# Patient Record
Sex: Female | Born: 1970 | Race: Black or African American | Hispanic: No | Smoking: Former smoker
Health system: Southern US, Community
[De-identification: ages and names within clinical notes are randomized; demographics above are authoritative.]

## PROBLEM LIST (undated history)

## (undated) DIAGNOSIS — B360 Pityriasis versicolor: Secondary | ICD-10-CM

## (undated) DIAGNOSIS — M549 Dorsalgia, unspecified: Secondary | ICD-10-CM

## (undated) DIAGNOSIS — M5126 Other intervertebral disc displacement, lumbar region: Secondary | ICD-10-CM

## (undated) DIAGNOSIS — I10 Essential (primary) hypertension: Secondary | ICD-10-CM

## (undated) DIAGNOSIS — D649 Anemia, unspecified: Secondary | ICD-10-CM

## (undated) DIAGNOSIS — L0293 Carbuncle, unspecified: Secondary | ICD-10-CM

## (undated) DIAGNOSIS — K219 Gastro-esophageal reflux disease without esophagitis: Secondary | ICD-10-CM

## (undated) DIAGNOSIS — R030 Elevated blood-pressure reading, without diagnosis of hypertension: Secondary | ICD-10-CM

## (undated) DIAGNOSIS — E109 Type 1 diabetes mellitus without complications: Secondary | ICD-10-CM

## (undated) DIAGNOSIS — N946 Dysmenorrhea, unspecified: Secondary | ICD-10-CM

## (undated) DIAGNOSIS — E876 Hypokalemia: Secondary | ICD-10-CM

## (undated) DIAGNOSIS — R142 Eructation: Secondary | ICD-10-CM

## (undated) HISTORY — DX: Gastro-esophageal reflux disease without esophagitis: K21.9

## (undated) HISTORY — DX: Anemia, unspecified: D64.9

## (undated) HISTORY — DX: Eructation: R14.2

## (undated) HISTORY — DX: Dorsalgia, unspecified: M54.9

## (undated) HISTORY — DX: Carbuncle, unspecified: L02.93

## (undated) HISTORY — DX: Type 1 diabetes mellitus without complications: E10.9

## (undated) HISTORY — DX: Hypokalemia: E87.6

## (undated) HISTORY — DX: Pityriasis versicolor: B36.0

## (undated) HISTORY — DX: Other intervertebral disc displacement, lumbar region: M51.26

## (undated) HISTORY — DX: Elevated blood-pressure reading, without diagnosis of hypertension: R03.0

## (undated) HISTORY — DX: Dysmenorrhea, unspecified: N94.6

---

## 1994-04-28 HISTORY — PX: ECTOPIC PREGNANCY SURGERY: SHX613

## 1997-10-06 ENCOUNTER — Emergency Department (HOSPITAL_COMMUNITY): Admission: EM | Admit: 1997-10-06 | Discharge: 1997-10-06 | Payer: Self-pay | Admitting: Internal Medicine

## 1998-06-17 DIAGNOSIS — E109 Type 1 diabetes mellitus without complications: Secondary | ICD-10-CM | POA: Insufficient documentation

## 1998-06-25 ENCOUNTER — Inpatient Hospital Stay (HOSPITAL_COMMUNITY): Admission: EM | Admit: 1998-06-25 | Discharge: 1998-06-27 | Payer: Self-pay | Admitting: Emergency Medicine

## 1998-08-05 ENCOUNTER — Emergency Department (HOSPITAL_COMMUNITY): Admission: EM | Admit: 1998-08-05 | Discharge: 1998-08-05 | Payer: Self-pay | Admitting: Internal Medicine

## 1998-08-05 ENCOUNTER — Encounter: Payer: Self-pay | Admitting: Internal Medicine

## 1999-01-30 ENCOUNTER — Encounter: Admission: RE | Admit: 1999-01-30 | Discharge: 1999-01-30 | Payer: Self-pay | Admitting: Hematology and Oncology

## 1999-03-10 ENCOUNTER — Encounter: Payer: Self-pay | Admitting: Emergency Medicine

## 1999-03-10 ENCOUNTER — Emergency Department (HOSPITAL_COMMUNITY): Admission: EM | Admit: 1999-03-10 | Discharge: 1999-03-10 | Payer: Self-pay | Admitting: Emergency Medicine

## 1999-03-24 ENCOUNTER — Emergency Department (HOSPITAL_COMMUNITY): Admission: EM | Admit: 1999-03-24 | Discharge: 1999-03-24 | Payer: Self-pay | Admitting: Emergency Medicine

## 1999-03-25 ENCOUNTER — Emergency Department (HOSPITAL_COMMUNITY): Admission: EM | Admit: 1999-03-25 | Discharge: 1999-03-25 | Payer: Self-pay | Admitting: Emergency Medicine

## 1999-05-20 ENCOUNTER — Encounter: Admission: RE | Admit: 1999-05-20 | Discharge: 1999-05-20 | Payer: Self-pay | Admitting: Internal Medicine

## 1999-06-03 ENCOUNTER — Encounter: Admission: RE | Admit: 1999-06-03 | Discharge: 1999-09-01 | Payer: Self-pay | Admitting: *Deleted

## 1999-06-18 ENCOUNTER — Encounter: Payer: Self-pay | Admitting: Emergency Medicine

## 1999-06-18 ENCOUNTER — Emergency Department (HOSPITAL_COMMUNITY): Admission: EM | Admit: 1999-06-18 | Discharge: 1999-06-18 | Payer: Self-pay | Admitting: Emergency Medicine

## 1999-09-04 ENCOUNTER — Emergency Department (HOSPITAL_COMMUNITY): Admission: EM | Admit: 1999-09-04 | Discharge: 1999-09-04 | Payer: Self-pay | Admitting: Emergency Medicine

## 1999-11-22 ENCOUNTER — Encounter: Admission: RE | Admit: 1999-11-22 | Discharge: 1999-11-22 | Payer: Self-pay | Admitting: Internal Medicine

## 1999-11-27 ENCOUNTER — Encounter: Admission: RE | Admit: 1999-11-27 | Discharge: 1999-11-27 | Payer: Self-pay | Admitting: Internal Medicine

## 2000-06-20 ENCOUNTER — Emergency Department (HOSPITAL_COMMUNITY): Admission: EM | Admit: 2000-06-20 | Discharge: 2000-06-20 | Payer: Self-pay | Admitting: *Deleted

## 2000-06-20 ENCOUNTER — Encounter: Payer: Self-pay | Admitting: *Deleted

## 2000-06-30 ENCOUNTER — Encounter: Payer: Self-pay | Admitting: Emergency Medicine

## 2000-06-30 ENCOUNTER — Emergency Department (HOSPITAL_COMMUNITY): Admission: EM | Admit: 2000-06-30 | Discharge: 2000-07-01 | Payer: Self-pay | Admitting: Emergency Medicine

## 2000-12-08 ENCOUNTER — Emergency Department (HOSPITAL_COMMUNITY): Admission: EM | Admit: 2000-12-08 | Discharge: 2000-12-08 | Payer: Self-pay | Admitting: Emergency Medicine

## 2001-12-30 ENCOUNTER — Encounter: Payer: Self-pay | Admitting: Emergency Medicine

## 2001-12-30 ENCOUNTER — Inpatient Hospital Stay (HOSPITAL_COMMUNITY): Admission: EM | Admit: 2001-12-30 | Discharge: 2002-01-01 | Payer: Self-pay | Admitting: Emergency Medicine

## 2001-12-31 ENCOUNTER — Encounter: Payer: Self-pay | Admitting: Internal Medicine

## 2003-08-16 ENCOUNTER — Inpatient Hospital Stay (HOSPITAL_COMMUNITY): Admission: EM | Admit: 2003-08-16 | Discharge: 2003-08-17 | Payer: Self-pay | Admitting: Emergency Medicine

## 2003-11-17 ENCOUNTER — Observation Stay (HOSPITAL_COMMUNITY): Admission: EM | Admit: 2003-11-17 | Discharge: 2003-11-18 | Payer: Self-pay | Admitting: Emergency Medicine

## 2003-12-14 ENCOUNTER — Inpatient Hospital Stay (HOSPITAL_COMMUNITY): Admission: EM | Admit: 2003-12-14 | Discharge: 2003-12-16 | Payer: Self-pay | Admitting: Emergency Medicine

## 2004-04-26 ENCOUNTER — Emergency Department (HOSPITAL_COMMUNITY): Admission: EM | Admit: 2004-04-26 | Discharge: 2004-04-26 | Payer: Self-pay

## 2004-04-29 ENCOUNTER — Emergency Department (HOSPITAL_COMMUNITY): Admission: EM | Admit: 2004-04-29 | Discharge: 2004-04-29 | Payer: Self-pay | Admitting: Family Medicine

## 2005-04-03 ENCOUNTER — Emergency Department (HOSPITAL_COMMUNITY): Admission: EM | Admit: 2005-04-03 | Discharge: 2005-04-03 | Payer: Self-pay | Admitting: Family Medicine

## 2005-11-09 ENCOUNTER — Emergency Department (HOSPITAL_COMMUNITY): Admission: EM | Admit: 2005-11-09 | Discharge: 2005-11-09 | Payer: Self-pay | Admitting: Emergency Medicine

## 2006-01-11 ENCOUNTER — Emergency Department (HOSPITAL_COMMUNITY): Admission: EM | Admit: 2006-01-11 | Discharge: 2006-01-11 | Payer: Self-pay | Admitting: Emergency Medicine

## 2006-01-27 ENCOUNTER — Emergency Department (HOSPITAL_COMMUNITY): Admission: EM | Admit: 2006-01-27 | Discharge: 2006-01-27 | Payer: Self-pay | Admitting: Emergency Medicine

## 2006-04-06 ENCOUNTER — Inpatient Hospital Stay (HOSPITAL_COMMUNITY): Admission: EM | Admit: 2006-04-06 | Discharge: 2006-04-09 | Payer: Self-pay | Admitting: Emergency Medicine

## 2006-10-19 ENCOUNTER — Ambulatory Visit: Payer: Self-pay | Admitting: Internal Medicine

## 2006-10-19 ENCOUNTER — Inpatient Hospital Stay (HOSPITAL_COMMUNITY): Admission: EM | Admit: 2006-10-19 | Discharge: 2006-10-22 | Payer: Self-pay | Admitting: Emergency Medicine

## 2006-11-06 ENCOUNTER — Encounter (INDEPENDENT_AMBULATORY_CARE_PROVIDER_SITE_OTHER): Payer: Self-pay | Admitting: Internal Medicine

## 2006-11-06 ENCOUNTER — Ambulatory Visit: Payer: Self-pay | Admitting: Hospitalist

## 2006-11-06 DIAGNOSIS — E876 Hypokalemia: Secondary | ICD-10-CM

## 2006-11-06 DIAGNOSIS — D649 Anemia, unspecified: Secondary | ICD-10-CM

## 2006-11-06 DIAGNOSIS — E109 Type 1 diabetes mellitus without complications: Secondary | ICD-10-CM

## 2006-11-06 HISTORY — DX: Anemia, unspecified: D64.9

## 2006-11-06 HISTORY — DX: Type 1 diabetes mellitus without complications: E10.9

## 2006-11-06 LAB — CONVERTED CEMR LAB
Basophils Absolute: 0 10*3/uL (ref 0.0–0.1)
Calcium: 9.4 mg/dL (ref 8.4–10.5)
Eosinophils Relative: 1 % (ref 0–5)
HCT: 34.5 % — ABNORMAL LOW (ref 36.0–46.0)
Hgb A1c MFr Bld: 11.6 %
Monocytes Absolute: 0.4 10*3/uL (ref 0.2–0.7)
Neutro Abs: 3.5 10*3/uL (ref 1.7–7.7)
Neutrophils Relative %: 60 % (ref 43–77)
Platelets: 309 10*3/uL (ref 150–400)
Potassium: 4.2 meq/L (ref 3.5–5.3)
Sodium: 131 meq/L — ABNORMAL LOW (ref 135–145)
WBC: 5.9 10*3/uL (ref 4.0–10.5)

## 2006-11-09 ENCOUNTER — Ambulatory Visit: Payer: Self-pay | Admitting: *Deleted

## 2006-11-09 LAB — CONVERTED CEMR LAB
Blood Glucose, Fingerstick: 80
Blood Glucose, Home Monitor: 2 mg/dL
Insulin/Carbohydrate Ratio: 1

## 2006-12-09 ENCOUNTER — Ambulatory Visit: Payer: Self-pay | Admitting: Infectious Disease

## 2006-12-09 ENCOUNTER — Encounter (INDEPENDENT_AMBULATORY_CARE_PROVIDER_SITE_OTHER): Payer: Self-pay | Admitting: Internal Medicine

## 2006-12-09 LAB — CONVERTED CEMR LAB
ALT: 16 units/L (ref 0–35)
AST: 15 units/L (ref 0–37)
Albumin: 4.1 g/dL (ref 3.5–5.2)
CO2: 28 meq/L (ref 19–32)
Calcium: 10.2 mg/dL (ref 8.4–10.5)
Chloride: 101 meq/L (ref 96–112)
Creatinine, Ser: 0.63 mg/dL (ref 0.40–1.20)
Glucose, Bld: 137 mg/dL — ABNORMAL HIGH (ref 70–99)
Potassium: 4.3 meq/L (ref 3.5–5.3)
Sodium: 138 meq/L (ref 135–145)
Total CHOL/HDL Ratio: 2.7
Triglycerides: 86 mg/dL (ref <150)

## 2006-12-17 ENCOUNTER — Telehealth (INDEPENDENT_AMBULATORY_CARE_PROVIDER_SITE_OTHER): Payer: Self-pay | Admitting: *Deleted

## 2007-02-03 ENCOUNTER — Telehealth (INDEPENDENT_AMBULATORY_CARE_PROVIDER_SITE_OTHER): Payer: Self-pay | Admitting: Internal Medicine

## 2007-02-18 ENCOUNTER — Ambulatory Visit: Payer: Self-pay | Admitting: Internal Medicine

## 2007-02-18 DIAGNOSIS — R141 Gas pain: Secondary | ICD-10-CM | POA: Insufficient documentation

## 2007-02-18 DIAGNOSIS — R142 Eructation: Secondary | ICD-10-CM

## 2007-02-18 DIAGNOSIS — R143 Flatulence: Secondary | ICD-10-CM

## 2007-02-18 HISTORY — DX: Eructation: R14.2

## 2007-02-18 LAB — CONVERTED CEMR LAB
Blood Glucose, Fingerstick: 207
Hgb A1c MFr Bld: 10.4 %
Ketones, urine, test strip: NEGATIVE
Protein, U semiquant: NEGATIVE

## 2007-02-19 ENCOUNTER — Encounter (INDEPENDENT_AMBULATORY_CARE_PROVIDER_SITE_OTHER): Payer: Self-pay | Admitting: Internal Medicine

## 2007-04-14 ENCOUNTER — Emergency Department (HOSPITAL_COMMUNITY): Admission: EM | Admit: 2007-04-14 | Discharge: 2007-04-14 | Payer: Self-pay | Admitting: Emergency Medicine

## 2007-07-06 ENCOUNTER — Emergency Department (HOSPITAL_COMMUNITY): Admission: EM | Admit: 2007-07-06 | Discharge: 2007-07-06 | Payer: Self-pay | Admitting: Emergency Medicine

## 2007-07-08 ENCOUNTER — Encounter (INDEPENDENT_AMBULATORY_CARE_PROVIDER_SITE_OTHER): Payer: Self-pay | Admitting: Internal Medicine

## 2007-07-08 ENCOUNTER — Ambulatory Visit: Payer: Self-pay | Admitting: Infectious Diseases

## 2007-07-08 DIAGNOSIS — L0292 Furuncle, unspecified: Secondary | ICD-10-CM | POA: Insufficient documentation

## 2007-07-08 DIAGNOSIS — L0293 Carbuncle, unspecified: Secondary | ICD-10-CM

## 2007-07-08 HISTORY — DX: Furuncle, unspecified: L02.92

## 2007-07-08 HISTORY — DX: Carbuncle, unspecified: L02.93

## 2007-07-08 LAB — CONVERTED CEMR LAB
Basophils Absolute: 0 10*3/uL (ref 0.0–0.1)
Basophils Relative: 0 % (ref 0–1)
Eosinophils Relative: 1 % (ref 0–5)
HCT: 37.6 % (ref 36.0–46.0)
Lymphocytes Relative: 28 % (ref 12–46)
Lymphs Abs: 2.7 10*3/uL (ref 0.7–4.0)
MCHC: 31.1 g/dL (ref 30.0–36.0)
Monocytes Absolute: 0.8 10*3/uL (ref 0.1–1.0)
Monocytes Relative: 9 % (ref 3–12)
Neutro Abs: 5.9 10*3/uL (ref 1.7–7.7)
Platelets: 251 10*3/uL (ref 150–400)
RBC: 4.35 M/uL (ref 3.87–5.11)
RDW: 14.1 % (ref 11.5–15.5)

## 2007-07-22 ENCOUNTER — Ambulatory Visit: Payer: Self-pay | Admitting: *Deleted

## 2007-07-28 ENCOUNTER — Telehealth (INDEPENDENT_AMBULATORY_CARE_PROVIDER_SITE_OTHER): Payer: Self-pay | Admitting: *Deleted

## 2007-08-31 ENCOUNTER — Emergency Department (HOSPITAL_COMMUNITY): Admission: EM | Admit: 2007-08-31 | Discharge: 2007-08-31 | Payer: Self-pay | Admitting: Family Medicine

## 2007-10-30 ENCOUNTER — Emergency Department (HOSPITAL_COMMUNITY): Admission: EM | Admit: 2007-10-30 | Discharge: 2007-10-30 | Payer: Self-pay | Admitting: Emergency Medicine

## 2007-11-01 ENCOUNTER — Encounter (INDEPENDENT_AMBULATORY_CARE_PROVIDER_SITE_OTHER): Payer: Self-pay | Admitting: *Deleted

## 2007-11-01 ENCOUNTER — Ambulatory Visit: Payer: Self-pay | Admitting: Internal Medicine

## 2007-11-01 LAB — CONVERTED CEMR LAB
Creatinine, Ser: 0.67 mg/dL (ref 0.40–1.20)
Glucose, Bld: 490 mg/dL — ABNORMAL HIGH (ref 70–99)
MCHC: 32.9 g/dL (ref 30.0–36.0)
MCV: 86.4 fL (ref 78.0–100.0)
Platelets: 236 10*3/uL (ref 150–400)
RDW: 14.2 % (ref 11.5–15.5)
Sodium: 132 meq/L — ABNORMAL LOW (ref 135–145)

## 2007-11-08 ENCOUNTER — Encounter (INDEPENDENT_AMBULATORY_CARE_PROVIDER_SITE_OTHER): Payer: Self-pay | Admitting: *Deleted

## 2007-11-08 ENCOUNTER — Ambulatory Visit: Payer: Self-pay | Admitting: Infectious Diseases

## 2007-11-08 LAB — CONVERTED CEMR LAB: Blood Glucose, Fingerstick: 209

## 2007-11-09 ENCOUNTER — Telehealth (INDEPENDENT_AMBULATORY_CARE_PROVIDER_SITE_OTHER): Payer: Self-pay | Admitting: *Deleted

## 2007-11-19 ENCOUNTER — Telehealth (INDEPENDENT_AMBULATORY_CARE_PROVIDER_SITE_OTHER): Payer: Self-pay | Admitting: *Deleted

## 2007-12-02 ENCOUNTER — Emergency Department (HOSPITAL_COMMUNITY): Admission: EM | Admit: 2007-12-02 | Discharge: 2007-12-03 | Payer: Self-pay | Admitting: Emergency Medicine

## 2007-12-03 ENCOUNTER — Telehealth (INDEPENDENT_AMBULATORY_CARE_PROVIDER_SITE_OTHER): Payer: Self-pay | Admitting: Internal Medicine

## 2008-01-06 ENCOUNTER — Telehealth (INDEPENDENT_AMBULATORY_CARE_PROVIDER_SITE_OTHER): Payer: Self-pay | Admitting: Internal Medicine

## 2008-01-27 DIAGNOSIS — M549 Dorsalgia, unspecified: Secondary | ICD-10-CM

## 2008-01-27 HISTORY — DX: Dorsalgia, unspecified: M54.9

## 2008-02-07 ENCOUNTER — Telehealth: Payer: Self-pay | Admitting: *Deleted

## 2008-02-09 ENCOUNTER — Encounter: Payer: Self-pay | Admitting: Internal Medicine

## 2008-02-09 ENCOUNTER — Ambulatory Visit: Payer: Self-pay | Admitting: Infectious Disease

## 2008-02-09 DIAGNOSIS — IMO0002 Reserved for concepts with insufficient information to code with codable children: Secondary | ICD-10-CM | POA: Insufficient documentation

## 2008-02-09 LAB — CONVERTED CEMR LAB: Hgb A1c MFr Bld: 10.1 %

## 2008-02-10 ENCOUNTER — Encounter (INDEPENDENT_AMBULATORY_CARE_PROVIDER_SITE_OTHER): Payer: Self-pay | Admitting: Internal Medicine

## 2008-02-10 ENCOUNTER — Telehealth: Payer: Self-pay | Admitting: Internal Medicine

## 2008-02-10 ENCOUNTER — Ambulatory Visit: Payer: Self-pay | Admitting: Infectious Disease

## 2008-02-11 ENCOUNTER — Ambulatory Visit (HOSPITAL_COMMUNITY): Admission: RE | Admit: 2008-02-11 | Discharge: 2008-02-11 | Payer: Self-pay | Admitting: Internal Medicine

## 2008-02-20 DIAGNOSIS — E559 Vitamin D deficiency, unspecified: Secondary | ICD-10-CM | POA: Insufficient documentation

## 2008-02-21 LAB — CONVERTED CEMR LAB
ALT: 15 units/L (ref 0–35)
AST: 12 units/L (ref 0–37)
Albumin: 4 g/dL (ref 3.5–5.2)
Alkaline Phosphatase: 80 units/L (ref 39–117)
BUN: 8 mg/dL (ref 6–23)
CO2: 24 meq/L (ref 19–32)
Calcium: 9.3 mg/dL (ref 8.4–10.5)
Chloride: 101 meq/L (ref 96–112)
Cholesterol: 146 mg/dL (ref 0–200)
Creatinine, Ser: 0.74 mg/dL (ref 0.40–1.20)
Creatinine, Urine: 51.9 mg/dL
Glucose, Bld: 357 mg/dL — ABNORMAL HIGH (ref 70–99)
HDL: 50 mg/dL (ref >39)
LDL Cholesterol: 79 mg/dL (ref 0–99)
Microalb Creat Ratio: 7.5 mg/g (ref 0.0–30.0)
Microalb, Ur: 0.39 mg/dL (ref 0.00–1.89)
Potassium: 4.5 meq/L (ref 3.5–5.3)
Sodium: 136 meq/L (ref 135–145)
TSH: 0.94 microintl units/mL (ref 0.350–4.50)
Total Bilirubin: 0.4 mg/dL (ref 0.3–1.2)
Total CHOL/HDL Ratio: 2.9
Total Protein: 7.6 g/dL (ref 6.0–8.3)
Triglycerides: 83 mg/dL (ref <150)
VLDL: 17 mg/dL (ref 0–40)
Vit D, 1,25-Dihydroxy: 21 — ABNORMAL LOW (ref 30–89)

## 2008-02-25 ENCOUNTER — Ambulatory Visit: Payer: Self-pay | Admitting: Internal Medicine

## 2008-02-25 DIAGNOSIS — M5126 Other intervertebral disc displacement, lumbar region: Secondary | ICD-10-CM

## 2008-02-25 HISTORY — DX: Other intervertebral disc displacement, lumbar region: M51.26

## 2008-02-28 ENCOUNTER — Telehealth: Payer: Self-pay | Admitting: *Deleted

## 2008-04-03 ENCOUNTER — Encounter (INDEPENDENT_AMBULATORY_CARE_PROVIDER_SITE_OTHER): Payer: Self-pay | Admitting: Internal Medicine

## 2008-04-03 ENCOUNTER — Ambulatory Visit: Payer: Self-pay | Admitting: Internal Medicine

## 2008-04-11 ENCOUNTER — Telehealth: Payer: Self-pay | Admitting: *Deleted

## 2008-04-26 ENCOUNTER — Telehealth: Payer: Self-pay | Admitting: *Deleted

## 2008-05-30 ENCOUNTER — Telehealth: Payer: Self-pay | Admitting: *Deleted

## 2008-05-31 ENCOUNTER — Telehealth: Payer: Self-pay | Admitting: *Deleted

## 2008-06-07 ENCOUNTER — Ambulatory Visit: Payer: Self-pay | Admitting: *Deleted

## 2008-06-07 ENCOUNTER — Encounter (INDEPENDENT_AMBULATORY_CARE_PROVIDER_SITE_OTHER): Payer: Self-pay | Admitting: Internal Medicine

## 2008-06-07 DIAGNOSIS — R030 Elevated blood-pressure reading, without diagnosis of hypertension: Secondary | ICD-10-CM

## 2008-06-07 DIAGNOSIS — IMO0001 Reserved for inherently not codable concepts without codable children: Secondary | ICD-10-CM

## 2008-06-07 HISTORY — DX: Reserved for inherently not codable concepts without codable children: IMO0001

## 2008-06-07 LAB — CONVERTED CEMR LAB
Blood Glucose, Fingerstick: 382
Hgb A1c MFr Bld: 11 %

## 2008-06-08 LAB — CONVERTED CEMR LAB
Alkaline Phosphatase: 94 units/L (ref 39–117)
CO2: 22 meq/L (ref 19–32)
Glucose, Bld: 377 mg/dL — ABNORMAL HIGH (ref 70–99)
Microalb Creat Ratio: 40.8 mg/g — ABNORMAL HIGH (ref 0.0–30.0)
Sodium: 138 meq/L (ref 135–145)
Total Bilirubin: 0.3 mg/dL (ref 0.3–1.2)
Total Protein: 7.9 g/dL (ref 6.0–8.3)

## 2008-06-21 ENCOUNTER — Other Ambulatory Visit: Payer: Self-pay | Admitting: Emergency Medicine

## 2008-06-21 ENCOUNTER — Inpatient Hospital Stay (HOSPITAL_COMMUNITY): Admission: AD | Admit: 2008-06-21 | Discharge: 2008-06-24 | Payer: Self-pay | Admitting: Infectious Disease

## 2008-06-21 ENCOUNTER — Ambulatory Visit: Payer: Self-pay | Admitting: Infectious Disease

## 2008-06-25 ENCOUNTER — Encounter: Payer: Self-pay | Admitting: Internal Medicine

## 2008-07-06 ENCOUNTER — Emergency Department (HOSPITAL_COMMUNITY): Admission: EM | Admit: 2008-07-06 | Discharge: 2008-07-06 | Payer: Self-pay | Admitting: Family Medicine

## 2008-08-14 ENCOUNTER — Ambulatory Visit: Payer: Self-pay | Admitting: Internal Medicine

## 2008-08-14 ENCOUNTER — Encounter (INDEPENDENT_AMBULATORY_CARE_PROVIDER_SITE_OTHER): Payer: Self-pay | Admitting: *Deleted

## 2008-08-14 LAB — CONVERTED CEMR LAB: Insulin/Carbohydrate Ratio: 1

## 2008-08-15 ENCOUNTER — Encounter (INDEPENDENT_AMBULATORY_CARE_PROVIDER_SITE_OTHER): Payer: Self-pay | Admitting: Internal Medicine

## 2008-08-15 LAB — CONVERTED CEMR LAB
Alkaline Phosphatase: 106 units/L (ref 39–117)
GFR calc Af Amer: >60 mL/min (ref >60)
Glucose, Bld: 134 mg/dL — ABNORMAL HIGH (ref 70–99)
Total Bilirubin: 0.2 mg/dL — ABNORMAL LOW (ref 0.3–1.2)
Total Protein: 7.8 g/dL (ref 6.0–8.3)

## 2008-08-24 ENCOUNTER — Encounter: Admission: RE | Admit: 2008-08-24 | Discharge: 2008-09-06 | Payer: Self-pay | Admitting: Internal Medicine

## 2008-08-29 ENCOUNTER — Encounter (INDEPENDENT_AMBULATORY_CARE_PROVIDER_SITE_OTHER): Payer: Self-pay | Admitting: Internal Medicine

## 2008-09-28 ENCOUNTER — Encounter (INDEPENDENT_AMBULATORY_CARE_PROVIDER_SITE_OTHER): Payer: Self-pay | Admitting: Internal Medicine

## 2008-11-28 ENCOUNTER — Telehealth: Payer: Self-pay | Admitting: Internal Medicine

## 2008-12-21 ENCOUNTER — Ambulatory Visit: Payer: Self-pay | Admitting: Internal Medicine

## 2008-12-21 DIAGNOSIS — N946 Dysmenorrhea, unspecified: Secondary | ICD-10-CM | POA: Insufficient documentation

## 2008-12-21 DIAGNOSIS — B360 Pityriasis versicolor: Secondary | ICD-10-CM | POA: Insufficient documentation

## 2008-12-21 DIAGNOSIS — K219 Gastro-esophageal reflux disease without esophagitis: Secondary | ICD-10-CM

## 2008-12-21 HISTORY — DX: Pityriasis versicolor: B36.0

## 2008-12-21 HISTORY — DX: Gastro-esophageal reflux disease without esophagitis: K21.9

## 2008-12-21 HISTORY — DX: Dysmenorrhea, unspecified: N94.6

## 2008-12-21 LAB — CONVERTED CEMR LAB
BUN: 10 mg/dL (ref 6–23)
Blood Glucose, Fingerstick: 125
HCT: 38.1 % (ref 36.0–46.0)
Hemoglobin: 12 g/dL (ref 12.0–15.0)
Hgb A1c MFr Bld: 10.4 %
Platelets: 307 10*3/uL (ref 150–400)
RDW: 14.6 % (ref 11.5–15.5)
Sodium: 140 meq/L (ref 135–145)
WBC: 7 10*3/uL (ref 4.0–10.5)

## 2008-12-25 ENCOUNTER — Ambulatory Visit (HOSPITAL_COMMUNITY): Admission: RE | Admit: 2008-12-25 | Discharge: 2008-12-25 | Payer: Self-pay | Admitting: Internal Medicine

## 2009-01-16 ENCOUNTER — Ambulatory Visit: Payer: Self-pay | Admitting: Internal Medicine

## 2009-05-23 ENCOUNTER — Ambulatory Visit: Payer: Self-pay | Admitting: Internal Medicine

## 2009-05-24 ENCOUNTER — Encounter: Payer: Self-pay | Admitting: Internal Medicine

## 2009-05-27 LAB — CONVERTED CEMR LAB
BUN: 11 mg/dL (ref 6–23)
CO2: 19 meq/L (ref 19–32)
Chloride: 97 meq/L (ref 96–112)
Cholesterol: 168 mg/dL (ref 0–200)
Creatinine, Ser: 0.7 mg/dL (ref 0.40–1.20)
Glucose, Bld: 353 mg/dL — ABNORMAL HIGH (ref 70–99)
Hemoglobin: 12.1 g/dL (ref 12.0–15.0)
MCHC: 31.9 g/dL (ref 30.0–36.0)
MCV: 90.5 fL (ref >=78.0)
Potassium: 3.9 meq/L (ref 3.5–5.3)
RBC: 4.19 M/uL (ref 3.87–5.11)

## 2009-05-28 ENCOUNTER — Telehealth: Payer: Self-pay | Admitting: Internal Medicine

## 2009-06-06 ENCOUNTER — Telehealth (INDEPENDENT_AMBULATORY_CARE_PROVIDER_SITE_OTHER): Payer: Self-pay | Admitting: *Deleted

## 2009-06-11 ENCOUNTER — Ambulatory Visit: Payer: Self-pay | Admitting: Internal Medicine

## 2009-06-11 LAB — CONVERTED CEMR LAB: Blood Glucose, Fingerstick: 186

## 2009-06-26 ENCOUNTER — Encounter (INDEPENDENT_AMBULATORY_CARE_PROVIDER_SITE_OTHER): Payer: Self-pay | Admitting: Internal Medicine

## 2009-11-27 ENCOUNTER — Telehealth: Payer: Self-pay | Admitting: Licensed Clinical Social Worker

## 2009-11-27 ENCOUNTER — Encounter (INDEPENDENT_AMBULATORY_CARE_PROVIDER_SITE_OTHER): Payer: Self-pay | Admitting: *Deleted

## 2009-11-27 ENCOUNTER — Ambulatory Visit: Payer: Self-pay | Admitting: Internal Medicine

## 2009-11-27 LAB — CONVERTED CEMR LAB: Microalb Creat Ratio: 25.7 mg/g (ref 0.0–30.0)

## 2009-12-03 ENCOUNTER — Emergency Department (HOSPITAL_COMMUNITY): Admission: EM | Admit: 2009-12-03 | Discharge: 2009-12-04 | Payer: Self-pay | Admitting: Emergency Medicine

## 2009-12-11 ENCOUNTER — Ambulatory Visit: Payer: Self-pay | Admitting: Internal Medicine

## 2009-12-25 ENCOUNTER — Ambulatory Visit: Payer: Self-pay | Admitting: Internal Medicine

## 2010-03-13 ENCOUNTER — Telehealth: Payer: Self-pay | Admitting: Licensed Clinical Social Worker

## 2010-04-16 ENCOUNTER — Encounter: Payer: Self-pay | Admitting: Internal Medicine

## 2010-04-16 LAB — HM DIABETES EYE EXAM

## 2010-05-30 NOTE — Consult Note (Signed)
Summary: DIABETIC EYE EXAMINATION REPORT  DIABETIC EYE EXAMINATION REPORT   Imported By: Margie Billet 05/06/2010 12:00:59  _____________________________________________________________________  External Attachment:    Type:   Image     Comment:   External Document

## 2010-05-30 NOTE — Miscellaneous (Signed)
Summary: Orders Update  Clinical Lists Changes  Orders: Added new Referral order of Social Work Referral (Social ) - Signed 

## 2010-05-30 NOTE — Assessment & Plan Note (Signed)
Summary: EST-2 WEEK F/U VISIT/CH   Vital Signs:  Patient profile:   40 year old female Height:      64.25 inches (163.19 cm) Weight:      229.8 pounds (104.45 kg) BMI:     39.28 Temp:     97.9 degrees F (36.61 degrees C) oral Pulse rate:   85 / minute BP sitting:   125 / 86  (left arm) Cuff size:   large  Vitals Entered By: Theotis Barrio NT II (December 11, 2009 8:59 AM) CC: CRAMPS  /  BACK PAIN CHRONIC   /  DM FOLLOW UPAPPT Is Patient Diabetic? Yes Did you bring your meter with you today? No Pain Assessment Patient in pain? yes     Location: back Intensity:      7 Type: SHARP/ CRAMP Onset of pain  MONTHLY  / CHRONIC Nutritional Status BMI of > 30 = obese CBG Result 433  Have you ever been in a relationship where you felt threatened, hurt or afraid?No   Does patient need assistance? Functional Status Self care Ambulation Normal   Primary Care Provider:  Silvestre Gunner MD  CC:  CRAMPS  /  BACK PAIN CHRONIC   /  DM FOLLOW UPAPPT.  History of Present Illness: 40 y/o female with pmh as outlined in the EMR. Who comes to the clinic for folowup of her DM and to followup on her status to get MAP.  Patient is not taking lantus due to cast now taking 70/30 30units and 18units, couldnt download meter, however CBG's are on average more than 300, and lowest was 150 in AM.   Patient is feeling well and denies CP, abdominal pain, nausea, vomiting, HA's, palpitations, blurred vision. fever, chills, diarrhea, constipation or SOB.    Preventive Screening-Counseling & Management  Alcohol-Tobacco     Alcohol drinks/day: 1     Alcohol type: wine     Smoking Status: current     Smoking Cessation Counseling: yes     Packs/Day: 2-3 cigs per week     Year Started: 1985     Year Quit: 1 year ago     Pack years: 1     Passive Smoke Exposure: no  Caffeine-Diet-Exercise     Does Patient Exercise: no  Current Medications (verified): 1)  Pen Needles 31g X 8 Mm  Misc (Insulin Pen  Needle) .... Use To Inject Insulin 2)  Truetrack Test   Strp (Glucose Blood) .... Use To Test Blood Glucose 4x Daily 3)  Lancets   Misc (Lancets) .... Use To Test Blood Glucose 4x Daily 4)  Vitamin D3 1000 Unit Caps (Cholecalciferol) .... Take 1 Tablet By Mouth Once A Day 5)  Ferrous Sulfate 325 (65 Fe) Mg Tabs (Ferrous Sulfate) .... Take 1 Tablet By Mouth Two Times A Day 6)  Omeprazole 20 Mg Cpdr (Omeprazole) .... Take 1 Tablet By Mouth Once A Day 7)  Novolin 70/30 70-30 % Susp (Insulin Isophane & Regular) .... 35 Units in Am, and 18 Units in Pm. 8)  Truetrack Blood Glucose  Devi (Blood Glucose Monitoring Suppl) .... Use As Instructed  Allergies (verified): No Known Drug Allergies  Review of Systems       As per HPI.   Physical Exam  General:  alert, well-developed, well-nourished, and well-hydrated.   Neck:  supple.   Lungs:  normal respiratory effort, normal breath sounds, no crackles, and no wheezes.   Heart:  normal rate, regular rhythm, no murmur, no gallop, and  no JVD.   Abdomen:  soft, non-tender, normal bowel sounds, and no distention.   Msk:  normal ROM, no joint tenderness, no joint swelling, no joint warmth, and no redness over joints.   Pulses:  2+ Extremities:  No edema. Neurologic:  alert & oriented X3, cranial nerves II-XII intact, strength normal in all extremities, sensation intact to light touch, and gait normal.   Skin:   turgor normal and no rashes.  Psych:  Oriented X3, memory intact for recent and remote, normally interactive, good eye contact, not anxious appearing, and not depressed appearing.     Impression & Recommendations:  Problem # 1:  DIABETES MELLITUS, TYPE I (ICD-250.01)  Patient cannot pay for lantus, now is back on 70/30 on 30 in am and 18 in pm.  currently CBG are high, with highest > 500 in pm, lowest is >150 in am.  will increase morning 70/30 insulin to 35 units in am.  Also patients metformin was stopped last due to to an unknown cause,  patient stated that she tolerated it well, will restart at 500bid and titrate up.  will see again in 2 weeks.    The following medications were removed from the medication list:    Novolog 100 Unit/ml Soln (Insulin aspart) ..... Inject 0-14 units before each meal, depending cbg and amount of carbohydrates. (follow dosing guide) Her updated medication list for this problem includes:    Novolin 70/30 70-30 % Susp (Insulin isophane & regular) .Marland KitchenMarland KitchenMarland KitchenMarland Kitchen 35 units in am, and 18 units in pm.    Metformin Hcl 500 Mg Tabs (Metformin hcl) .Marland Kitchen... Take 1 tablet by mouth two times a day  Orders: Ophthalmology Referral (Ophthalmology)  Problem # 2:  GERD (ICD-530.81) Well controlled on current treatment, No new changes made today, Will continue to monitor.   Her updated medication list for this problem includes:    Omeprazole 20 Mg Cpdr (Omeprazole) .Marland Kitchen... Take 1 tablet by mouth once a day  Problem # 3:  DYSMENORRHEA (ICD-625.3) pain with cramps, not relieved with OTC meds will give small amount of tramadol for as needed use.   Problem # 4:  ANEMIA-NOS (ICD-285.9) Well controlled on current treatment, No new changes made today, Will continue to monitor.   Her updated medication list for this problem includes:    Ferrous Sulfate 325 (65 Fe) Mg Tabs (Ferrous sulfate) .Marland Kitchen... Take 1 tablet by mouth two times a day  Complete Medication List: 1)  Pen Needles 31g X 8 Mm Misc (Insulin pen needle) .... Use to inject insulin 2)  Truetrack Test Strp (Glucose blood) .... Use to test blood glucose 4x daily 3)  Lancets Misc (Lancets) .... Use to test blood glucose 4x daily 4)  Vitamin D3 1000 Unit Caps (Cholecalciferol) .... Take 1 tablet by mouth once a day 5)  Ferrous Sulfate 325 (65 Fe) Mg Tabs (Ferrous sulfate) .... Take 1 tablet by mouth two times a day 6)  Omeprazole 20 Mg Cpdr (Omeprazole) .... Take 1 tablet by mouth once a day 7)  Novolin 70/30 70-30 % Susp (Insulin isophane & regular) .... 35 units in am, and  18 units in pm. 8)  Truetrack Blood Glucose Devi (Blood glucose monitoring suppl) .... Use as instructed 9)  Tramadol Hcl 50 Mg Tabs (Tramadol hcl) .... Take 1 tablet by mouth three times a day as needed for pain 10)  Metformin Hcl 500 Mg Tabs (Metformin hcl) .... Take 1 tablet by mouth two times a day  Other Orders:  Capillary Blood Glucose/CBG (16109) Capillary Blood Glucose/CBG (60454)  Patient Instructions: 1)  Please schedule a follow-up appointment in 2 weeks. 2)  please increase your morning dose of 70/30 to 35 units.  3)  Prescription for pain medication was sent to your pharmacy.  Prescriptions: TRAMADOL HCL 50 MG TABS (TRAMADOL HCL) Take 1 tablet by mouth three times a day as needed for pain  #30 x 0   Entered and Authorized by:   Darnelle Maffucci MD   Signed by:   Darnelle Maffucci MD on 12/11/2009   Method used:   Print then Give to Patient   RxID:   [id number] METFORMIN HCL 500 MG TABS (METFORMIN HCL) Take 1 tablet by mouth two times a day  #60 x 3   Entered and Authorized by:   Darnelle Maffucci MD   Signed by:   Darnelle Maffucci MD on 12/11/2009   Method used:   Print then Give to Patient   RxID:   [id number] TRAMADOL HCL 50 MG TABS (TRAMADOL HCL) Take 1 tablet by mouth three times a day as needed for pain  #30 x 0   Entered and Authorized by:   Darnelle Maffucci MD   Signed by:   Darnelle Maffucci MD on 12/11/2009   Method used:   Faxed to ...       [facility] (retail)       [address]       [place], [place]  [address]       Ph: [phone]       Fax: [phone]   RxID:   [id number]    Prevention & Chronic Care Immunizations   Influenza vaccine: Fluvax Non-MCR  (02/09/2008)   Influenza vaccine deferral: Refused  (12/21/2008)    Tetanus booster: Not documented   Td booster deferral: Deferred  (12/21/2008)    Pneumococcal vaccine: Not documented  Other Screening   Pap smear: Not documented   Pap smear  action/deferral: Deferred  (12/21/2008)   Smoking status: current  (12/11/2009)   Smoking cessation counseling: yes  (12/11/2009)  Diabetes Mellitus   HgbA1C: 10.3  (11/27/2009)   Hemoglobin A1C due: 02/06/2007    Eye exam: Not documented   Diabetic eye exam action/deferral: Ophthalmology referral  (12/11/2009)    Foot exam: Not documented   Foot exam action/deferral: Do today   High risk foot: No  (08/14/2008)   Foot care education: Not documented    Urine microalbumin/creatinine ratio: 25.7  (11/27/2009)    Diabetes flowsheet reviewed?: Yes   Progress toward A1C goal: Improved  Lipids   Total Cholesterol: 168  (05/24/2009)   Lipid panel action/deferral: Lipid Panel ordered   LDL: 100  (05/24/2009)   LDL Direct: Not documented   HDL: 52  (05/24/2009)   Triglycerides: 81  (05/24/2009)  Self-Management Support :   Personal Goals (by the next clinic visit) :     Personal A1C goal: 8  (05/23/2009)     Personal blood pressure goal: 130/80  (05/23/2009)     Personal LDL goal: 100  (05/23/2009)    Patient will work on the following items until the next clinic visit to reach self-care goals:     Medications and monitoring: take my medicines every day, check my blood sugar, bring all of my medications to every visit, examine my feet every day  (12/11/2009)     Eating: drink diet soda or water instead of juice or soda, eat more vegetables, use fresh or frozen  vegetables, eat foods that are low in salt, eat baked foods instead of fried foods, eat fruit for snacks and desserts, limit or avoid alcohol  (12/11/2009)     Activity: take a 30 minute walk every day  (11/27/2009)     Other: bring meter to every visit  (05/23/2009)    Diabetes self-management support: Resources for patients handout, Written self-care plan  (12/11/2009)   Diabetes care plan printed   Last diabetes self-management training by diabetes educator: 11/27/2009   Last medical nutrition therapy: 11/09/2006     Self-management comments: WALKS A LOT / STUDENT      Resource handout printed.   Nursing Instructions: Refer for screening diabetic eye exam (see order)

## 2010-05-30 NOTE — Consult Note (Addendum)
Summary: DIABETIC EYE  DIABETIC EYE   Imported By: Margie Billet 05/08/2010 09:55:30  _____________________________________________________________________  External Attachment:    Type:   Image     Comment:   External Document  Appended Document: DIABETIC EYE   Diabetic Eye Exam  Procedure date:  04/16/2010  Findings:      Mild non-proliferative diabetic retinopathy.  ou   Procedures Next Due Date:    Diabetic Eye Exam: 10/2010   Diabetic Eye Exam  Procedure date:  04/16/2010  Findings:      Mild non-proliferative diabetic retinopathy.  ou   Procedures Next Due Date:    Diabetic Eye Exam: 10/2010

## 2010-05-30 NOTE — Progress Notes (Signed)
Summary: Soc. 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 Work Call placed to: Patient Summary of Call: Left message for patient to call social work.   Follow-up for Phone Call        No answer at phone number.   Dorothe Pea  March 13, 2010 11:51 AM

## 2010-05-30 NOTE — Progress Notes (Signed)
Summary: will need insulin soon/diabetes testing supplies/dmr  Phone Note Call from Patient Call back at Home Phone [phone]   Caller: Patient Summary of Call: called about testing supp;ly order - no insurance to have mail order bill- she gave me permission to destroy the order form saying she never had spoken withthem. She requested assistanc in gettin gher insulin form [facility] pharmacy:  waiting on [employer] and has less than 1/2 a vail left (   ~ 7 days at current dose)  Follow-up for Phone Call        called [facility]- they are waiting on her to sign papers to order insulin. patient has an appoitnketn on Monday to see Dr.- will alert them to dicsuss plan for insulin supply while wating on [facility] to get it in- often 4-6 weeks.  called patient- she will go by [facility] to sign papers tomorrow Follow-up by: Jamison Neighbor RD,CDE,  June 06, 2009 3:43 PM

## 2010-05-30 NOTE — Miscellaneous (Signed)
Summary: [facility]: Diabetes Testing Supplies  [facility]: Diabetes Testing Supplies   Imported By: Florinda Marker 06/29/2009 15:47:58  _____________________________________________________________________  External Attachment:    Type:   Image     Comment:   External Document

## 2010-05-30 NOTE — Assessment & Plan Note (Signed)
Summary: ACUTE-3 MONTH CHECK UP FOR DIABETES/CFB   Vital Signs:  Patient profile:   40 year old female Height:      64.25 inches (163.19 cm) Weight:      211.6 pounds (96.18 kg) BMI:     36.17 Pulse rate:   89 / minute BP sitting:   110 / 74  (left arm) Cuff size:   large  Vitals Entered By: Krystal Eaton Duncan Dull) (May 23, 2009 11:20 AM) CC: f/u diabetes Is Patient Diabetic? Yes Did you bring your meter with you today? Yes Research Study Name: Pt brought the wrong meter today, has used this meter in over and the dates are not accurate. Pain Assessment Patient in pain? no      CBG Result 469  Have you ever been in a relationship where you felt threatened, hurt or afraid?No   Does patient need assistance? Functional Status Self care Ambulation Normal       Primary Care Provider:  Silvestre Gunner MD  CC:  f/u diabetes.  History of Present Illness: Pt is a 40 yo AAF with PMH of DM, anemia and GERD who came to the Clinic for DM f/u. She has been using lantus 30 units with novolog 5-6 units before each meal instead of insulin 70/30 as instructed and was changed on 11/2008. She said she never got insulin 70/30. Her CBg runs 200s-400s, she has no symptoms including HA, CP, SOB, diarrhea or urinary frequency. Current smoker 2-3 cig /week, social ETOH, denies drug use.   Preventive Screening-Counseling & Management  Alcohol-Tobacco     Alcohol drinks/day: 1     Alcohol type: wine     Smoking Status: current     Smoking Cessation Counseling: yes     Packs/Day: 2-3 cigs per week     Year Started: 1985     Year Quit: 1 year ago     Pack years: 1     Passive Smoke Exposure: no  Problems Prior to Update: 1)  Tinea Versicolor  (ICD-111.0) 2)  Gerd  (ICD-530.81) 3)  Dysmenorrhea  (ICD-625.3) 4)  Elevated Blood Pressure  (ICD-796.2) 5)  Herniated Lumbar Disc  (ICD-722.10) 6)  Vitamin D Deficiency  (ICD-268.9) 7)  Back Pain With Radiculopathy  (ICD-729.2) 8)   Diabetes Mellitus, Type I  (ICD-250.01) 9)  Anemia-nos  (ICD-285.9) 10)  Hypokalemia  (ICD-276.8) 11)  Boils, Recurrent  (ICD-680.9) 12)  Excessive Belching  (ICD-787.3)  Medications Prior to Update: 1)  Pen Needles 31g X 8 Mm  Misc (Insulin Pen Needle) .... Use To Inject Insulin 2)  Truetrack Test   Strp (Glucose Blood) .... Use To Test Blood Glucose 4x Daily 3)  Lancets   Misc (Lancets) .... Use To Test Blood Glucose 4x Daily 4)  Vitamin D3 1000 Unit Caps (Cholecalciferol) .... Take 1 Tablet By Mouth Once A Day 5)  Ferrous Sulfate 325 (65 Fe) Mg Tabs (Ferrous Sulfate) .... Take 1 Tablet By Mouth Two Times A Day 6)  Omeprazole 20 Mg Cpdr (Omeprazole) .... Take 1 Tablet By Mouth Once A Day 7)  Ketoconazole 2 % Crea (Ketoconazole) .... Apply To Eczema Three Times A Day. 8)  Novolog Mix 70/30 Flexpen 70-30 % Susp (Insulin Aspart Prot & Aspart) .... 40u in The Morning, and 20u in The Evening.  Current Medications (verified): 1)  Pen Needles 31g X 8 Mm  Misc (Insulin Pen Needle) .... Use To Inject Insulin 2)  Truetrack Test   Strp (Glucose Blood) .... Use  To Test Blood Glucose 4x Daily 3)  Lancets   Misc (Lancets) .... Use To Test Blood Glucose 4x Daily 4)  Vitamin D3 1000 Unit Caps (Cholecalciferol) .... Take 1 Tablet By Mouth Once A Day 5)  Ferrous Sulfate 325 (65 Fe) Mg Tabs (Ferrous Sulfate) .... Take 1 Tablet By Mouth Two Times A Day 6)  Omeprazole 20 Mg Cpdr (Omeprazole) .... Take 1 Tablet By Mouth Once A Day 7)  Ketoconazole 2 % Crea (Ketoconazole) .... Apply To Eczema Three Times A Day. 8)  Novolog Mix 70/30 Flexpen 70-30 % Susp (Insulin Aspart Prot & Aspart) .... 40u in The Morning, and 20u in The Evening.  Allergies (verified): No Known Drug Allergies  Past History:  Past Medical History: Last updated: 06/07/2008 Diabetes mellitus, type I.5 Anemia-NOS Back pain-         MRI 10/09 with 1.  disc protrusion at L4-5 and L5-S1 w/ mild lateral recess encroachment on L5 nerve  root.                                2.  Mod facet dz at L4-5 and L5-S1.  Past Surgical History: Last updated: 12/21/2008 Ectopic pregnacny 1996  Family History: Last updated: 11/08/2007 mother: HTN  Social History: Last updated: 11/08/2007 lives with mom at home, quit smoking and drinking last year  Risk Factors: Smoking Status: current (05/23/2009) Packs/Day: 2-3 cigs per week (05/23/2009) Passive Smoke Exposure: no (05/23/2009)  Family History: Reviewed history from 11/08/2007 and no changes required. mother: HTN  Social History: Reviewed history from 11/08/2007 and no changes required. lives with mom at home, quit smoking and drinking last year  Review of Systems       The patient complains of severe indigestion/heartburn.  The patient denies fever, vision loss, hoarseness, chest pain, syncope, peripheral edema, prolonged cough, headaches, hemoptysis, abdominal pain, and melena.    Physical Exam  General:  alert, well-developed, well-nourished, well-hydrated, and overweight-appearing.   Head:  normocephalic.   Eyes:  vision grossly intact, pupils equal, pupils round, and pupils reactive to light.   Ears:  no external deformities.   Nose:  no external erythema and no nasal discharge.   Mouth:  pharynx pink and moist.   Neck:  supple.   Lungs:  normal respiratory effort, normal breath sounds, no crackles, and no wheezes.   Heart:  normal rate, regular rhythm, no murmur, no gallop, and no JVD.   Abdomen:  soft, non-tender, normal bowel sounds, and no distention.   Msk:  normal ROM, no joint tenderness, no joint swelling, no joint warmth, and no redness over joints.   Pulses:  2+ Extremities:  No edema. Neurologic:  alert & oriented X3, cranial nerves II-XII intact, strength normal in all extremities, sensation intact to light touch, and gait normal.     Impression & Recommendations:  Problem # 1:  DIABETES MELLITUS, TYPE I (ICD-250.01) Assessment  Deteriorated Her CBG has been runs high with increase A1C from 10.4 to 11.5. She has been using lantus 30 units. This is likely due to not enough insulin. I will re-fax the prescription of insulin 70/30 to her pharmacy and have DM referral for DM education with Jamison Neighbor. Because she has no symptoms, will not admit her. Before she can get 70/30 insulin, asked her to get novolg 10 units. Will check BMET to see if any acidosis. I have told her to start new 70/30 insulin  as current dose in the list. Then recheck DM in 3 months. She missed her eye exam and will call for appointment for her.  Her updated medication list for this problem includes:    Novolog Mix 70/30 Flexpen 70-30 % Susp (Insulin aspart prot & aspart) .Marland KitchenMarland KitchenMarland KitchenMarland Kitchen 40u in the morning, and 20u in the evening.  Orders: T-Hgb A1C (in-house) [phone]) T- Capillary Blood Glucose (72536) T-Lipid Profile [phone]) T-Basic Metabolic Panel ([id number]) Diabetic Clinic Referral (Diabetic)  Labs Reviewed: Creat: 0.68 (12/21/2008)    Reviewed HgBA1c results: 11.5 (05/23/2009)  10.4 (12/21/2008)  Problem # 2:  ANEMIA-NOS (ICD-285.9) Assessment: Unchanged Will check CBC for HgB level. Her updated medication list for this problem includes:    Ferrous Sulfate 325 (65 Fe) Mg Tabs (Ferrous sulfate) .Marland Kitchen... Take 1 tablet by mouth two times a day  Orders: T-CBC No Diff ([id number])  Hgb: 12.0 (12/21/2008)   Hct: 38.1 (12/21/2008)   Platelets: 307 (12/21/2008) RBC: 4.32 (12/21/2008)   RDW: 14.6 (12/21/2008)   WBC: 7.0 (12/21/2008) MCV: 88.2 (12/21/2008)   MCHC: 31.5 (12/21/2008) TSH: 0.940 (02/09/2008)  Problem # 3:  GERD (ICD-530.81) Assessment: Unchanged Still has acid refux and respond well to PPI. Will refill for her.  Her updated medication list for this problem includes:    Omeprazole 20 Mg Cpdr (Omeprazole) .Marland Kitchen... Take 1 tablet by mouth once a day  Labs Reviewed: Hgb: 12.0 (12/21/2008)   Hct: 38.1 (12/21/2008)  Complete Medication  List: 1)  Pen Needles 31g X 8 Mm Misc (Insulin pen needle) .... Use to inject insulin 2)  Truetrack Test Strp (Glucose blood) .... Use to test blood glucose 4x daily 3)  Lancets Misc (Lancets) .... Use to test blood glucose 4x daily 4)  Vitamin D3 1000 Unit Caps (Cholecalciferol) .... Take 1 tablet by mouth once a day 5)  Ferrous Sulfate 325 (65 Fe) Mg Tabs (Ferrous sulfate) .... Take 1 tablet by mouth two times a day 6)  Omeprazole 20 Mg Cpdr (Omeprazole) .... Take 1 tablet by mouth once a day 7)  Ketoconazole 2 % Crea (Ketoconazole) .... Apply to eczema three times a day. 8)  Novolog Mix 70/30 Flexpen 70-30 % Susp (Insulin aspart prot & aspart) .... 40u in the morning, and 20u in the evening.  Patient Instructions: 1)  Please schedule a follow-up appointment in 3 months. 2)  Will call you if any abnormal labs.  3)  Please use insulin 70/30 for now and check your sugar regularly. 4)  Stop Smoking Tips: Choose a Quit date. Cut down before the Quit date. decide what you will do as a substitute when you feel the urge to smoke(gum,toothpick,exercise). Prescriptions: OMEPRAZOLE 20 MG CPDR (OMEPRAZOLE) Take 1 tablet by mouth once a day  #30 x 3   Entered and Authorized by:   Jackson Latino MD   Signed by:   Jackson Latino MD on 05/23/2009   Method used:   Faxed to ...       [facility] (retail)       [address]       [place], [place]  [address]       Ph: [phone]       Fax: [phone]   RxID:   [id number] KETOCONAZOLE 2 % CREA (KETOCONAZOLE) apply to eczema three times a day.  #1 x 1   Entered and Authorized by:   Jackson Latino MD   Signed by:   Jackson Latino MD on 05/23/2009   Method used:  Faxed to ...       [facility] (retail)       [address]       [place], [place]  [address]       Ph: [phone]       Fax: [phone]   RxID:   [id number] NOVOLOG MIX 70/30 FLEXPEN 70-30 %  SUSP (INSULIN ASPART PROT & ASPART) 40U in the morning, and 20U in the evening.  #1 month x 5   Entered and Authorized by:   Jackson Latino MD   Signed by:   Jackson Latino MD on 05/23/2009   Method used:   Faxed to ...       [facility] (retail)       [address]       [place], [place]  [address]       Ph: [phone]       Fax: [phone]   RxID:   [id number]   Prevention & Chronic Care Immunizations   Influenza vaccine: Fluvax Non-MCR  (02/09/2008)   Influenza vaccine deferral: Refused  (12/21/2008)    Tetanus booster: Not documented   Td booster deferral: Deferred  (12/21/2008)    Pneumococcal vaccine: Not documented  Other Screening   Pap smear: Not documented   Pap smear action/deferral: Deferred  (12/21/2008)   Smoking status: current  (05/23/2009)   Smoking cessation counseling: yes  (05/23/2009)  Diabetes Mellitus   HgbA1C: 11.5  (05/23/2009)   Hemoglobin A1C due: 02/06/2007    Eye exam: Not documented   Diabetic eye exam action/deferral: Ophthalmology referral  (12/21/2008)    Foot exam: Not documented   Foot exam action/deferral: Do today   High risk foot: No  (08/14/2008)   Foot care education: Not documented    Urine microalbumin/creatinine ratio: 40.8  (06/07/2008)    Diabetes flowsheet reviewed?: Yes   Progress toward A1C goal: Unchanged  Lipids   Total Cholesterol: 146  (02/09/2008)   Lipid panel action/deferral: Lipid Panel ordered   LDL: 79  (02/09/2008)   LDL Direct: Not documented   HDL: 50  (02/09/2008)   Triglycerides: 83  (02/09/2008)  Self-Management Support :   Personal Goals (by the next clinic visit) :     Personal A1C goal: 8  (05/23/2009)     Personal blood pressure goal: 130/80  (05/23/2009)     Personal LDL goal: 100  (05/23/2009)    Patient will work on the following items until the next clinic visit to reach self-care goals:     Medications and monitoring: take my medicines  every day, check my blood sugar  (05/23/2009)     Eating: drink diet soda or water instead of juice or soda, eat foods that are low in salt, eat baked foods instead of fried foods  (05/23/2009)     Activity: take a 30 minute walk every day  (05/23/2009)     Other: bring meter to every visit  (05/23/2009)    Diabetes self-management support: Written self-care plan  (05/23/2009)   Diabetes care plan printed   Last diabetes self-management training by diabetes educator: 08/14/2008   Referred for diabetes self-mgmt training.   Last medical nutrition therapy: 11/09/2006  Process Orders Check Orders Results:     [facility]: ABN not required for this insurance Tests Sent for requisitioning (May 23, 2009 12:07 PM):     05/23/2009: [facility] -- T-Lipid Profile [[id number]] (signed)     05/23/2009:  [facility] -- T-Basic Metabolic Panel [id number] (signed)     05/23/2009: [facility] -- T-CBC No Diff [[id number]] (signed)   Laboratory Results   Blood Tests   Date/Time Received: May 23, 2009  Date/Time Reported: Burke Keels  May 23, 2009 11:39 AM   HGBA1C: 11.5%   (Normal Range: Non-Diabetic - 3-6%   Control Diabetic - 6-8%) CBG Random:: 469mg /dL
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
## 2010-05-30 NOTE — Assessment & Plan Note (Signed)
Summary: EST-2 WEEK RECHECK/CH   Vital Signs:  Patient profile:   40 year old female Height:      64.25 inches (163.19 cm) Weight:      228.5 pounds (103.86 kg) BMI:     39.06 Temp:     98.2 degrees F (36.78 degrees C) oral Pulse rate:   84 / minute BP sitting:   121 / 81  (right arm)  Vitals Entered By: Stanton Kidney Ditzler RN (December 25, 2009 9:09 AM) CC: blurry vison Is Patient Diabetic? Yes Did you bring your meter with you today? Yes unable to down load Pain Assessment Patient in pain? no      Nutritional Status BMI of > 30 = obese Nutritional Status Detail appetite good CBG Result 391  Have you ever been in a relationship where you felt threatened, hurt or afraid?denies   Does patient need assistance? Functional Status Self care Ambulation Normal Comments FU - doing well. Cont to have back pain. Blurred vision - ck right eye.   Primary Care Provider:  Silvestre Gunner MD  CC:  blurry vison.  History of Present Illness: 40 y/o female with pmh as outlined in the EMR. Who comes to the clinic for folowup of her DM and to followup on her status to get MAP, also c/o of blurry vision.   blurry vision comes on when the patient is looking at a near object and then at a far one, which then she seens the objects being blurry, and it takes a few seconds for her to focus her vision again, given that she is diabetic she will need to see optho.   DM, now taking 70/30 30units and 18units, couldnt download meter, however CBG's are on average more than 300, and lowest was 90 in AM.   Patient is feeling well and denies CP, abdominal pain, nausea, vomiting, HA's, palpitations. fever, chills, diarrhea, constipation or SOB.    Depression History:      The patient denies a depressed mood most of the day and a diminished interest in her usual daily activities.         Preventive Screening-Counseling & Management  Alcohol-Tobacco     Alcohol drinks/day: 1     Alcohol type: wine  Smoking Status: current     Smoking Cessation Counseling: yes     Packs/Day: 3-4  cigs per week     Year Started: 1985     Year Quit: 1 year ago     Pack years: 1     Passive Smoke Exposure: no  Caffeine-Diet-Exercise     Does Patient Exercise: no  Current Medications (verified): 1)  Pen Needles 31g X 8 Mm  Misc (Insulin Pen Needle) .... Use To Inject Insulin 2)  Truetrack Test   Strp (Glucose Blood) .... Use To Test Blood Glucose 4x Daily 3)  Lancets   Misc (Lancets) .... Use To Test Blood Glucose 4x Daily 4)  Vitamin D3 1000 Unit Caps (Cholecalciferol) .... Take 1 Tablet By Mouth Once A Day 5)  Ferrous Sulfate 325 (65 Fe) Mg Tabs (Ferrous Sulfate) .... Take 1 Tablet By Mouth Two Times A Day 6)  Omeprazole 20 Mg Cpdr (Omeprazole) .... Take 1 Tablet By Mouth Once A Day 7)  Novolin 70/30 70-30 % Susp (Insulin Isophane & Regular) .... 35 Units in Am, and 20 Units in Pm. 8)  Truetrack Blood Glucose  Devi (Blood Glucose Monitoring Suppl) .... Use As Instructed 9)  Tramadol Hcl 50 Mg Tabs (Tramadol Hcl) .Marland KitchenMarland KitchenMarland Kitchen  Take 1 Tablet By Mouth Three Times A Day As Needed For Pain 10)  Metformin Hcl 1000 Mg Tabs (Metformin Hcl) .... Take 1 Tablet By Mouth Two Times A Day  Allergies (verified): No Known Drug Allergies  Social History: Packs/Day:  3-4  cigs per week  Review of Systems       As per HPI  Physical Exam  General:  alert, well-developed, well-nourished, and well-hydrated.   Lungs:  normal respiratory effort, normal breath sounds, no crackles, and no wheezes.   Heart:  normal rate, regular rhythm, no murmur, no gallop, and no JVD.   Abdomen:  soft, non-tender, normal bowel sounds, and no distention.   Pulses:  2+ Extremities:  No edema.   Impression & Recommendations:  Problem # 1:  DIABETES MELLITUS, TYPE I (ICD-250.01) Assessment Comment Only tolerated metformin well, will increase to 1000BID, and increase 70/30 pm dose to 20 from 18 and keep am dose of 35.  will slowly  titrate up .  Her updated medication list for this problem includes:    Novolin 70/30 70-30 % Susp (Insulin isophane & regular) .Marland KitchenMarland KitchenMarland KitchenMarland Kitchen 35 units in am, and 20 units in pm.    Metformin Hcl 1000 Mg Tabs (Metformin hcl) .Marland Kitchen... Take 1 tablet by mouth two times a day  Orders: Ophthalmology Referral (Ophthalmology)  Problem # 2:  ELEVATED BLOOD PRESSURE (ICD-796.2) Assessment: Comment Only doing well, no needs for meds now.   BP today: 121/81 Prior BP: 125/86 (12/11/2009)  Labs Reviewed: Creat: 0.70 (05/24/2009) Chol: 168 (05/24/2009)   HDL: 52 (05/24/2009)   LDL: 100 (05/24/2009)   TG: 81 (05/24/2009)  Instructed in low sodium diet (DASH Handout) and behavior modification.    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   Complete Medication List: 1)  Pen Needles 31g X 8 Mm Misc (Insulin pen needle) .... Use to inject insulin 2)  Truetrack Test Strp (Glucose blood) .... Use to test blood glucose 4x daily 3)  Lancets Misc (Lancets) .... Use to test blood glucose 4x daily 4)  Vitamin D3 1000 Unit Caps (Cholecalciferol) .... Take 1 tablet by mouth once a day 5)  Ferrous Sulfate 325 (65 Fe) Mg Tabs (Ferrous sulfate) .... Take 1 tablet by mouth two times a day 6)  Omeprazole 20 Mg Cpdr (Omeprazole) .... Take 1 tablet by mouth once a day 7)  Novolin 70/30 70-30 % Susp (Insulin isophane & regular) .... 35 units in am, and 20 units in pm. 8)  Truetrack Blood Glucose Devi (Blood glucose monitoring suppl) .... Use as instructed 9)  Tramadol Hcl 50 Mg Tabs (Tramadol hcl) .... Take 1 tablet by mouth three times a day as needed for pain 10)  Metformin Hcl 1000 Mg Tabs (Metformin hcl) .... Take 1 tablet by mouth two times a day  Other Orders: Capillary Blood Glucose/CBG (19147)  Patient Instructions: 1)  Please schedule a follow-up appointment in 1 month with PCP. Prescriptions: METFORMIN HCL 1000 MG TABS (METFORMIN HCL) Take 1 tablet by mouth two  times a day  #62 x 6   Entered and Authorized by:   Darnelle Maffucci MD   Signed by:   Darnelle Maffucci MD on 12/25/2009   Method used:   Electronically to        [facility] [id number]* (retail)       [address]       [place], [place]  [address]       Ph:  [phone]       Fax: [phone]   RxID:   [id number]    Prevention & Chronic Care Immunizations   Influenza vaccine: Fluvax Non-MCR  (02/09/2008)   Influenza vaccine deferral: Refused  (12/21/2008)    Tetanus booster: Not documented   Td booster deferral: Deferred  (12/21/2008)    Pneumococcal vaccine: Not documented  Other Screening   Pap smear: Not documented   Pap smear action/deferral: Deferred  (12/21/2008)   Smoking status: current  (12/25/2009)   Smoking cessation counseling: yes  (12/25/2009)  Diabetes Mellitus   HgbA1C: 10.3  (11/27/2009)   Hemoglobin A1C due: 02/06/2007    Eye exam: Not documented   Diabetic eye exam action/deferral: Ophthalmology referral  (12/11/2009)    Foot exam: Not documented   Foot exam action/deferral: Do today   High risk foot: No  (08/14/2008)   Foot care education: Not documented    Urine microalbumin/creatinine ratio: 25.7  (11/27/2009)    Diabetes flowsheet reviewed?: Yes   Progress toward A1C goal: Unchanged  Lipids   Total Cholesterol: 168  (05/24/2009)   Lipid panel action/deferral: Lipid Panel ordered   LDL: 100  (05/24/2009)   LDL Direct: Not documented   HDL: 52  (05/24/2009)   Triglycerides: 81  (05/24/2009)  Self-Management Support :   Personal Goals (by the next clinic visit) :     Personal A1C goal: 8  (05/23/2009)     Personal blood pressure goal: 130/80  (05/23/2009)     Personal LDL goal: 100  (05/23/2009)    Patient will work on the following items until the next clinic visit to reach self-care goals:     Medications and monitoring: take my medicines every day, check my blood sugar, bring all of my medications to every visit   (12/25/2009)     Eating: drink diet soda or water instead of juice or soda, eat more vegetables, use fresh or frozen vegetables, eat foods that are low in salt, eat fruit for snacks and desserts  (12/25/2009)     Activity: take a 30 minute walk every day  (12/25/2009)     Other: bring meter to every visit  (05/23/2009)    Diabetes self-management support: Written self-care plan, Education handout, Resources for patients handout  (12/25/2009)   Diabetes care plan printed   Diabetes education handout printed   Last diabetes self-management training by diabetes educator: 11/27/2009   Last medical nutrition therapy: 11/09/2006      Resource handout printed.

## 2010-05-30 NOTE — Progress Notes (Signed)
Summary: refill/ hla  Phone Note Refill Request Message from:  Patient on May 28, 2009 3:08 PM  Refills Requested: Medication #1:  TRUETRACK TEST   STRP Use to test blood glucose 4x daily   Dosage confirmed as above?Dosage Confirmed   Supply Requested: 1 year test 3x daily  Initial call taken by: Marin Roberts RN,  May 28, 2009 3:08 PM    Prescriptions: Gilman Schmidt TEST   STRP (GLUCOSE BLOOD) Use to test blood glucose 4x daily  #120 x 11   Entered and Authorized by:   Doneen Poisson MD   Signed by:   Doneen Poisson MD on 05/28/2009   Method used:   Faxed to ...       [facility] (retail)       [address]       [place], [place]  [address]       Ph: [phone]       Fax: [phone]   RxID:   [id number]

## 2010-05-30 NOTE — Assessment & Plan Note (Signed)
Summary: f/u with diabetes and check meter (riofrio)/cfb   Vital Signs:  Patient profile:   40 year old female Height:      64.25 inches (163.19 cm) Weight:      216.4 pounds (98.36 kg) BMI:     36.99 Temp:     98.6 degrees F (37.00 degrees C) oral Pulse rate:   95 / minute BP sitting:   134 / 83  (left arm)  Vitals Entered By: Stanton Kidney Ditzler RN (June 11, 2009 11:02 AM) Is Patient Diabetic? Yes Did you bring your meter with you today? Yes Pain Assessment Patient in pain? no      Nutritional Status BMI of > 30 = obese Nutritional Status Detail appt good CBG Result 186  Have you ever been in a relationship where you felt threatened, hurt or afraid?denies   Does patient need assistance? Functional Status Self care Ambulation Normal Comments FU diabetes - [facility] meds at [facility].   Primary Care Provider:  Silvestre Gunner MD   History of Present Illness: 40 y/o female with pmh as outlined in the EMR. Who comes to the clinic for folowup of her DM and to followup on her status to get [facility].  Patient has been taking lantus 35 units two times a day for the last 2 weeks, because she had couple of vials left at home and she was unable to get 70/30 at the [employer], since she is not installed inthe [facility].  Her glucometer today demonstrated CBG's in the low 200's the majority of the time with scattered post-prandial values in the 300-400 range. There was 1 episode of hypoglycemia registered and she was able to noticed the symptoms indicating low blood sugar.  Patient is feeling well and denies CP, abdominal pain, nausea, vomiting, HA's, palpitations, blurred vision. fever, chills, diarrhea, constipation or SOB.  CBG in the office was 186.  Depression History:      The patient denies a depressed mood most of the day and a diminished interest in her usual daily activities.        The patient denies that she feels like life is not worth living, denies that she wishes that she were  dead, and denies that she has thought about ending her life.         Preventive Screening-Counseling & Management  Alcohol-Tobacco     Alcohol drinks/day: 1     Alcohol type: wine     Smoking Status: current     Smoking Cessation Counseling: yes     Packs/Day: 2-3 cigs per week     Year Started: 1985     Year Quit: 1 year ago     Pack years: 1     Passive Smoke Exposure: no  Caffeine-Diet-Exercise     Does Patient Exercise: no  Problems Prior to Update: 1)  Tinea Versicolor  (ICD-111.0) 2)  Gerd  (ICD-530.81) 3)  Dysmenorrhea  (ICD-625.3) 4)  Elevated Blood Pressure  (ICD-796.2) 5)  Herniated Lumbar Disc  (ICD-722.10) 6)  Vitamin D Deficiency  (ICD-268.9) 7)  Back Pain With Radiculopathy  (ICD-729.2) 8)  Diabetes Mellitus, Type I  (ICD-250.01) 9)  Anemia-nos  (ICD-285.9) 10)  Hypokalemia  (ICD-276.8) 11)  Boils, Recurrent  (ICD-680.9) 12)  Excessive Belching  (ICD-787.3)  Current Problems (verified): 1)  Tinea Versicolor  (ICD-111.0) 2)  Gerd  (ICD-530.81) 3)  Dysmenorrhea  (ICD-625.3) 4)  Elevated Blood Pressure  (ICD-796.2) 5)  Herniated Lumbar Disc  (ICD-722.10) 6)  Vitamin D Deficiency  (  ICD-268.9) 7)  Back Pain With Radiculopathy  (ICD-729.2) 8)  Diabetes Mellitus, Type I  (ICD-250.01) 9)  Anemia-nos  (ICD-285.9) 10)  Hypokalemia  (ICD-276.8) 11)  Boils, Recurrent  (ICD-680.9) 12)  Excessive Belching  (ICD-787.3)  Medications Prior to Update: 1)  Pen Needles 31g X 8 Mm  Misc (Insulin Pen Needle) .... Use To Inject Insulin 2)  Truetrack Test   Strp (Glucose Blood) .... Use To Test Blood Glucose 4x Daily 3)  Lancets   Misc (Lancets) .... Use To Test Blood Glucose 4x Daily 4)  Vitamin D3 1000 Unit Caps (Cholecalciferol) .... Take 1 Tablet By Mouth Once A Day 5)  Ferrous Sulfate 325 (65 Fe) Mg Tabs (Ferrous Sulfate) .... Take 1 Tablet By Mouth Two Times A Day 6)  Omeprazole 20 Mg Cpdr (Omeprazole) .... Take 1 Tablet By Mouth Once A Day 7)  Ketoconazole 2 % Crea  (Ketoconazole) .... Apply To Eczema Three Times A Day. 8)  Novolog Mix 70/30 Flexpen 70-30 % Susp (Insulin Aspart Prot & Aspart) .... 40u in The Morning, and 20u in The Evening.  Current Medications (verified): 1)  Pen Needles 31g X 8 Mm  Misc (Insulin Pen Needle) .... Use To Inject Insulin 2)  Truetrack Test   Strp (Glucose Blood) .... Use To Test Blood Glucose 4x Daily 3)  Lancets   Misc (Lancets) .... Use To Test Blood Glucose 4x Daily 4)  Vitamin D3 1000 Unit Caps (Cholecalciferol) .... Take 1 Tablet By Mouth Once A Day 5)  Ferrous Sulfate 325 (65 Fe) Mg Tabs (Ferrous Sulfate) .... Take 1 Tablet By Mouth Two Times A Day 6)  Omeprazole 20 Mg Cpdr (Omeprazole) .... Take 1 Tablet By Mouth Once A Day 7)  Novolog Mix 70/30 Flexpen 70-30 % Susp (Insulin Aspart Prot & Aspart) .... 40u in The Morning, and 20u in The Evening.  Allergies (verified): No Known Drug Allergies  Past History:  Past Medical History: Last updated: 06/07/2008 Diabetes mellitus, type I.5 Anemia-NOS Back pain-         MRI 10/09 with 1.  disc protrusion at L4-5 and L5-S1 w/ mild lateral recess encroachment on L5 nerve root.                                2.  Mod facet dz at L4-5 and L5-S1.  Past Surgical History: Last updated: 12/21/2008 Ectopic pregnacny 1996  Family History: Last updated: 11/08/2007 mother: HTN  Social History: Last updated: 11/08/2007 lives with mom at home, quit smoking and drinking last year  Risk Factors: Alcohol Use: 1 (06/11/2009) Exercise: no (06/11/2009)  Risk Factors: Smoking Status: current (06/11/2009) Packs/Day: 2-3 cigs per week (06/11/2009) Passive Smoke Exposure: no (06/11/2009)  Review of Systems       As per HPI.  Physical Exam  General:  alert, well-developed, well-nourished, and well-hydrated.   Lungs:  normal respiratory effort, normal breath sounds, no crackles, and no wheezes.   Heart:  normal rate, regular rhythm, no murmur, no gallop, and no JVD.     Abdomen:  soft, non-tender, normal bowel sounds, and no distention.   Msk:  normal ROM, no joint tenderness, no joint swelling, no joint warmth, and no redness over joints.   Extremities:  No edema. Neurologic:  alert & oriented X3, cranial nerves II-XII intact, strength normal in all extremities, sensation intact to light touch, and gait normal.     Impression & Recommendations:  Problem # 1:  DIABETES MELLITUS, TYPE I (ICD-250.01) Patient has been using lantus two times a day and would like to continue using it instead of 70/30;she reports that her sugar are better controlled while using lantus two times a day. She was not using novolog for meal coverage. Will continue lantus 35 unitsbid for now; will also provide table guide for novlog use during meal coverage (0-14 units before depending CBG's levels). Patient also instructed to follow a low carb diet and advised to check her blood sugar at least 3 times a day.  Her updated medication list for this problem includes:    Lantus 100 Unit/ml Soln (Insulin glargine) ..... Inject 35 units two times a day    Novolog 100 Unit/ml Soln (Insulin aspart) ..... Inject 0-14 units before each meal, depending cbg and amount of carbohydrates. (follow dosing guide)  Problem # 2:  GERD (ICD-530.81) Patient reports good control of her reflux symptoms while using omeprazole.Will continue same regimen and will refresh information regarding lifestyle modification changes.  Her updated medication list for this problem includes:    Omeprazole 20 Mg Cpdr (Omeprazole) .Marland Kitchen... Take 1 tablet by mouth once a day  Problem # 3:  ANEMIA-NOS (ICD-285.9) Last Hgb 12.1; will continue ferrous sulfate. Patient denies abnormal bleeding.  Her updated medication list for this problem includes:    Ferrous Sulfate 325 (65 Fe) Mg Tabs (Ferrous sulfate) .Marland Kitchen... Take 1 tablet by mouth two times a day  Hgb: 12.1 (05/24/2009)   Hct: 37.9 (05/24/2009)   Platelets: 291  (05/24/2009) RBC: 4.19 (05/24/2009)   RDW: 13.9 (05/24/2009)   WBC: 5.4 (05/24/2009) MCV: 90.5 (05/24/2009)   MCHC: 31.9 (05/24/2009) TSH: 0.940 (02/09/2008)  Complete Medication List: 1)  Pen Needles 31g X 8 Mm Misc (Insulin pen needle) .... Use to inject insulin 2)  Truetrack Test Strp (Glucose blood) .... Use to test blood glucose 4x daily 3)  Lancets Misc (Lancets) .... Use to test blood glucose 4x daily 4)  Vitamin D3 1000 Unit Caps (Cholecalciferol) .... Take 1 tablet by mouth once a day 5)  Ferrous Sulfate 325 (65 Fe) Mg Tabs (Ferrous sulfate) .... Take 1 tablet by mouth two times a day 6)  Omeprazole 20 Mg Cpdr (Omeprazole) .... Take 1 tablet by mouth once a day 7)  Lantus 100 Unit/ml Soln (Insulin glargine) .... Inject 35 units two times a day 8)  Novolog 100 Unit/ml Soln (Insulin aspart) .... Inject 0-14 units before each meal, depending cbg and amount of carbohydrates. (follow dosing guide)  Other Orders: Capillary Blood Glucose/CBG (04540)  Patient Instructions: 1)  Please schedule a follow-up appointment in 2 months. 2)  Take your medications as prescribed. 3)  Please follow low carbohydrates diet (less than 65grams per day). 4)  Use your lantus 35 units two times a day for now until you can get your Medication assistance through [facility]. 5)  Also use novolog 0-14 units before meals depending on your blood sugar level (follow the table for right amount at each time). 6)  Check your blood sugar three times per day and bring your glucometer to every appoinment. 7)  You need to lose weight. Consider a lower calorie diet and regular exercise.  8)  Do not skipped meals.   Prevention & Chronic Care Immunizations   Influenza vaccine: Fluvax Non-MCR  (02/09/2008)   Influenza vaccine deferral: Refused  (12/21/2008)    Tetanus booster: Not documented   Td booster deferral: Deferred  (12/21/2008)  Pneumococcal vaccine: Not documented  Other Screening    Pap smear: Not documented   Pap smear action/deferral: Deferred  (12/21/2008)   Smoking status: current  (06/11/2009)   Smoking cessation counseling: yes  (06/11/2009)  Diabetes Mellitus   HgbA1C: 11.5  (05/23/2009)   Hemoglobin A1C due: 02/06/2007    Eye exam: Not documented   Diabetic eye exam action/deferral: Ophthalmology referral  (12/21/2008)    Foot exam: Not documented   Foot exam action/deferral: Do today   High risk foot: No  (08/14/2008)   Foot care education: Not documented    Urine microalbumin/creatinine ratio: 40.8  (06/07/2008)  Lipids   Total Cholesterol: 168  (05/24/2009)   Lipid panel action/deferral: Lipid Panel ordered   LDL: 100  (05/24/2009)   LDL Direct: Not documented   HDL: 52  (05/24/2009)   Triglycerides: 81  (05/24/2009)  Self-Management Support :   Personal Goals (by the next clinic visit) :     Personal A1C goal: 8  (05/23/2009)     Personal blood pressure goal: 130/80  (05/23/2009)     Personal LDL goal: 100  (05/23/2009)    Patient will work on the following items until the next clinic visit to reach self-care goals:     Medications and monitoring: take my medicines every day, check my blood sugar, bring all of my medications to every visit, examine my feet every day  (06/11/2009)     Eating: drink diet soda or water instead of juice or soda, eat more vegetables, use fresh or frozen vegetables, eat foods that are low in salt, eat baked foods instead of fried foods, eat fruit for snacks and desserts  (06/11/2009)     Activity: take a 30 minute walk every day  (05/23/2009)     Other: bring meter to every visit  (05/23/2009)    Diabetes self-management support: Written self-care plan  (05/23/2009)   Last diabetes self-management training by diabetes educator: 08/14/2008   Last medical nutrition therapy: 11/09/2006

## 2010-05-30 NOTE — Progress Notes (Signed)
Summary: Soc. Work  Programme researcher, broadcasting/film/video of Call: C/B from patient.   I've encouraged her to reschedule the appmt she missed in october.  I've advised her to certify with Chauncey Reading for [facility] and also visit with [facility] program for med assist.  She said that she has already discussed her income situation with [facility] and she will not be eligible until after jan. 1st and [facility] was also unable to assist her at this time.   I told her to try and certify after Jan. 1st.

## 2010-07-09 ENCOUNTER — Encounter: Payer: Self-pay | Admitting: Internal Medicine

## 2010-07-15 LAB — GLUCOSE, CAPILLARY: Glucose-Capillary: 469 mg/dL — ABNORMAL HIGH (ref 70–99)

## 2010-08-02 LAB — GLUCOSE, CAPILLARY: Glucose-Capillary: 380 mg/dL — ABNORMAL HIGH (ref 70–99)

## 2010-08-03 LAB — GLUCOSE, CAPILLARY: Glucose-Capillary: 125 mg/dL — ABNORMAL HIGH (ref 70–99)

## 2010-08-05 ENCOUNTER — Encounter: Payer: Self-pay | Admitting: Internal Medicine

## 2010-08-08 LAB — CULTURE, ROUTINE-ABSCESS

## 2010-08-13 LAB — HEPATIC FUNCTION PANEL
ALT: 18 U/L (ref 0–35)
AST: 23 U/L (ref 0–37)
Albumin: 3.3 g/dL — ABNORMAL LOW (ref 3.5–5.2)
Alkaline Phosphatase: 87 U/L (ref 39–117)
Total Bilirubin: 1.4 mg/dL — ABNORMAL HIGH (ref 0.3–1.2)
Total Protein: 7.4 g/dL (ref 6.0–8.3)

## 2010-08-13 LAB — GLUCOSE, CAPILLARY
Glucose-Capillary: 120 mg/dL — ABNORMAL HIGH (ref 70–99)
Glucose-Capillary: 153 mg/dL — ABNORMAL HIGH (ref 70–99)
Glucose-Capillary: 177 mg/dL — ABNORMAL HIGH (ref 70–99)
Glucose-Capillary: 186 mg/dL — ABNORMAL HIGH (ref 70–99)
Glucose-Capillary: 190 mg/dL — ABNORMAL HIGH (ref 70–99)
Glucose-Capillary: 211 mg/dL — ABNORMAL HIGH (ref 70–99)
Glucose-Capillary: 218 mg/dL — ABNORMAL HIGH (ref 70–99)
Glucose-Capillary: 272 mg/dL — ABNORMAL HIGH (ref 70–99)
Glucose-Capillary: 288 mg/dL — ABNORMAL HIGH (ref 70–99)
Glucose-Capillary: 307 mg/dL — ABNORMAL HIGH (ref 70–99)
Glucose-Capillary: 446 mg/dL — ABNORMAL HIGH (ref 70–99)
Glucose-Capillary: 564 mg/dL (ref 70–99)
Glucose-Capillary: 576 mg/dL (ref 70–99)

## 2010-08-13 LAB — BASIC METABOLIC PANEL
BUN: 4 mg/dL — ABNORMAL LOW (ref 6–23)
BUN: 4 mg/dL — ABNORMAL LOW (ref 6–23)
BUN: 6 mg/dL (ref 6–23)
CO2: 10 mEq/L — ABNORMAL LOW (ref 19–32)
CO2: 13 mEq/L — ABNORMAL LOW (ref 19–32)
CO2: 17 mEq/L — ABNORMAL LOW (ref 19–32)
CO2: 22 mEq/L (ref 19–32)
Calcium: 7.7 mg/dL — ABNORMAL LOW (ref 8.4–10.5)
Calcium: 7.7 mg/dL — ABNORMAL LOW (ref 8.4–10.5)
Calcium: 8 mg/dL — ABNORMAL LOW (ref 8.4–10.5)
Chloride: 106 mEq/L (ref 96–112)
Chloride: 108 mEq/L (ref 96–112)
Chloride: 108 mEq/L (ref 96–112)
Chloride: 112 mEq/L (ref 96–112)
Chloride: 117 mEq/L — ABNORMAL HIGH (ref 96–112)
Creatinine, Ser: 0.75 mg/dL (ref 0.4–1.2)
Creatinine, Ser: 1.07 mg/dL (ref 0.4–1.2)
GFR calc Af Amer: >60 mL/min (ref >60)
GFR calc Af Amer: >60 mL/min (ref >60)
GFR calc Af Amer: >60 mL/min (ref >60)
GFR calc Af Amer: >60 mL/min (ref >60)
GFR calc non Af Amer: >60 mL/min (ref >60)
GFR calc non Af Amer: >60 mL/min (ref >60)
GFR calc non Af Amer: >60 mL/min (ref >60)
GFR calc non Af Amer: >60 mL/min (ref >60)
Glucose, Bld: 225 mg/dL — ABNORMAL HIGH (ref 70–99)
Glucose, Bld: 232 mg/dL — ABNORMAL HIGH (ref 70–99)
Glucose, Bld: 235 mg/dL — ABNORMAL HIGH (ref 70–99)
Glucose, Bld: 266 mg/dL — ABNORMAL HIGH (ref 70–99)
Glucose, Bld: 293 mg/dL — ABNORMAL HIGH (ref 70–99)
Potassium: 3.5 mEq/L (ref 3.5–5.1)
Potassium: 4.3 mEq/L (ref 3.5–5.1)
Potassium: 4.7 mEq/L (ref 3.5–5.1)
Potassium: 6.4 mEq/L (ref 3.5–5.1)
Sodium: 131 mEq/L — ABNORMAL LOW (ref 135–145)
Sodium: 133 mEq/L — ABNORMAL LOW (ref 135–145)
Sodium: 134 mEq/L — ABNORMAL LOW (ref 135–145)
Sodium: 134 mEq/L — ABNORMAL LOW (ref 135–145)
Sodium: 134 mEq/L — ABNORMAL LOW (ref 135–145)
Sodium: 137 mEq/L (ref 135–145)

## 2010-08-13 LAB — BLOOD GAS, ARTERIAL
Acid-base deficit: 18.4 mmol/L — ABNORMAL HIGH (ref 0.0–2.0)
Bicarbonate: 8.4 mEq/L — ABNORMAL LOW (ref 20.0–24.0)
Patient temperature: 97.6
TCO2: 9.1 mmol/L (ref 0–100)
pCO2 arterial: 22.5 mmHg — ABNORMAL LOW (ref 35.0–45.0)

## 2010-08-13 LAB — RENAL FUNCTION PANEL
Albumin: 2.6 g/dL — ABNORMAL LOW (ref 3.5–5.2)
Albumin: 2.6 g/dL — ABNORMAL LOW (ref 3.5–5.2)
Albumin: 2.9 g/dL — ABNORMAL LOW (ref 3.5–5.2)
Albumin: 3 g/dL — ABNORMAL LOW (ref 3.5–5.2)
BUN: 4 mg/dL — ABNORMAL LOW (ref 6–23)
BUN: 5 mg/dL — ABNORMAL LOW (ref 6–23)
BUN: 8 mg/dL (ref 6–23)
Calcium: 7.2 mg/dL — ABNORMAL LOW (ref 8.4–10.5)
Chloride: 104 mEq/L (ref 96–112)
Chloride: 109 mEq/L (ref 96–112)
Creatinine, Ser: 0.62 mg/dL (ref 0.4–1.2)
Creatinine, Ser: 0.86 mg/dL (ref 0.4–1.2)
GFR calc Af Amer: >60 mL/min (ref >60)
GFR calc Af Amer: >60 mL/min (ref >60)
GFR calc Af Amer: >60 mL/min (ref >60)
GFR calc non Af Amer: >60 mL/min (ref >60)
GFR calc non Af Amer: >60 mL/min (ref >60)
GFR calc non Af Amer: >60 mL/min (ref >60)
Glucose, Bld: 230 mg/dL — ABNORMAL HIGH (ref 70–99)
Phosphorus: 1.3 mg/dL — ABNORMAL LOW (ref 2.3–4.6)
Potassium: 3.5 mEq/L (ref 3.5–5.1)
Potassium: 3.5 mEq/L (ref 3.5–5.1)

## 2010-08-13 LAB — DIFFERENTIAL
Basophils Absolute: 0 10*3/uL (ref 0.0–0.1)
Basophils Absolute: 0 10*3/uL (ref 0.0–0.1)
Eosinophils Absolute: 0 10*3/uL (ref 0.0–0.7)
Eosinophils Relative: 0 % (ref 0–5)
Eosinophils Relative: 0 % (ref 0–5)
Lymphocytes Relative: 16 % (ref 12–46)
Lymphocytes Relative: 7 % — ABNORMAL LOW (ref 12–46)
Lymphs Abs: 1.1 10*3/uL (ref 0.7–4.0)
Monocytes Relative: 3 % (ref 3–12)
Neutro Abs: 13.9 10*3/uL — ABNORMAL HIGH (ref 1.7–7.7)
Neutro Abs: 9.8 10*3/uL — ABNORMAL HIGH (ref 1.7–7.7)
Neutrophils Relative %: 90 % — ABNORMAL HIGH (ref 43–77)

## 2010-08-13 LAB — CARDIAC PANEL(CRET KIN+CKTOT+MB+TROPI)
CK, MB: 0.8 ng/mL (ref 0.3–4.0)
CK, MB: 0.9 ng/mL (ref 0.3–4.0)
CK, MB: 1.1 ng/mL (ref 0.3–4.0)
Relative Index: INVALID (ref 0.0–2.5)
Relative Index: INVALID (ref 0.0–2.5)
Total CK: 56 U/L (ref 7–177)
Total CK: 68 U/L (ref 7–177)
Total CK: 68 U/L (ref 7–177)
Troponin I: <0.01 ng/mL (ref 0.00–0.06)

## 2010-08-13 LAB — PREGNANCY, URINE: Preg Test, Ur: NEGATIVE

## 2010-08-13 LAB — COMPREHENSIVE METABOLIC PANEL
ALT: 24 U/L (ref 0–35)
AST: 18 U/L (ref 0–37)
Alkaline Phosphatase: 121 U/L — ABNORMAL HIGH (ref 39–117)
CO2: 14 mEq/L — ABNORMAL LOW (ref 19–32)
Chloride: 99 mEq/L (ref 96–112)
GFR calc Af Amer: 59 mL/min — ABNORMAL LOW (ref >60)
GFR calc non Af Amer: 48 mL/min — ABNORMAL LOW (ref >60)
Sodium: 131 mEq/L — ABNORMAL LOW (ref 135–145)
Total Bilirubin: 2 mg/dL — ABNORMAL HIGH (ref 0.3–1.2)

## 2010-08-13 LAB — URINALYSIS, ROUTINE W REFLEX MICROSCOPIC
Bilirubin Urine: NEGATIVE
Leukocytes, UA: NEGATIVE
Nitrite: NEGATIVE
Specific Gravity, Urine: 1.035 — ABNORMAL HIGH (ref 1.005–1.030)
pH: 5.5 (ref 5.0–8.0)

## 2010-08-13 LAB — CBC
HCT: 29.7 % — ABNORMAL LOW (ref 36.0–46.0)
MCV: 88.4 fL (ref 78.0–100.0)
MCV: 89.2 fL (ref 78.0–100.0)
MCV: 89.3 fL (ref 78.0–100.0)
Platelets: 176 10*3/uL (ref 150–400)
Platelets: 256 10*3/uL (ref 150–400)
Platelets: 272 10*3/uL (ref 150–400)
Platelets: UNDETERMINED 10*3/uL (ref 150–400)
RBC: 3.83 MIL/uL — ABNORMAL LOW (ref 3.87–5.11)
RBC: 4.58 MIL/uL (ref 3.87–5.11)
RDW: 14.9 % (ref 11.5–15.5)
RDW: 15.5 % (ref 11.5–15.5)
WBC: 13.2 10*3/uL — ABNORMAL HIGH (ref 4.0–10.5)
WBC: 15.5 10*3/uL — ABNORMAL HIGH (ref 4.0–10.5)
WBC: 5.7 10*3/uL (ref 4.0–10.5)

## 2010-08-13 LAB — LIPID PANEL
Cholesterol: 132 mg/dL (ref 0–200)
HDL: 36 mg/dL — ABNORMAL LOW (ref >39)

## 2010-08-13 LAB — MAGNESIUM: Magnesium: 1.8 mg/dL (ref 1.5–2.5)

## 2010-08-13 LAB — HAPTOGLOBIN: Haptoglobin: 134 mg/dL (ref 16–200)

## 2010-08-13 LAB — HIV ANTIBODY (ROUTINE TESTING W REFLEX): HIV: NONREACTIVE

## 2010-08-13 LAB — LACTIC ACID, PLASMA: Lactic Acid, Venous: 1.5 mmol/L (ref 0.5–2.2)

## 2010-09-10 NOTE — Discharge Summary (Signed)
Robin [name], Robin [name] NO.:  [id number]   MEDICAL RECORD NO.:  [id number]          PATIENT TYPE:  INP   LOCATION:  4740                         FACILITY:  [facility]   PHYSICIAN:  Acey Lav, MD  DATE OF BIRTH:  07/21/1970   DATE OF ADMISSION:  06/21/2008  DATE OF DISCHARGE:  06/24/2008                               DISCHARGE SUMMARY   DISCHARGE DIAGNOSES:  1. Diabetic ketoacidosis - anion gap 17 on admission.  Patient with      diabetes mellitus type 2, insulin dependent, resolved on discharge.  2. Nausea, vomiting and abdominal pain - likely secondary to #1,      resolved on discharge.  3. Polysubstance use, cocaine-positive urine drug screen.  4. Diabetes, insulin dependent.  5. Anemia of chronic disease, baseline hemoglobin 10-11.  6. Low phosphate and low magnesium, repleted, likely secondary to #1      and poor oral nutrition; the patient also has low albumin at 2.6.   DISCHARGE MEDICATIONS:  1. Iron sulfate 325-mg tablet twice daily.  2. Lantus 30 units at bedtime.  3. Insulin NovoLog 70/30, 5 units 3 times per day with meals.   The patient was discharged from the unit in stable condition.  DKA  resolved.  Electrolytes within normal limits.  She will follow up in  outpatient clinic.  We will call her for the exact appointment date.  On  follow-up appointment, please check electrolytes, BMET, to assess for  hypokalemia.  Also check magnesium and phosphate levels and replete as  necessary.  May check CBC to assess if hemoglobin level is stable  between 10 and 11.  Follow up on compliance with insulin.  The patient's  hemoglobin A1c is still elevated at 11, so follow up if patient  compliant with insulin.  Also, during the hospitalization, CBGs ranged  from 200-300 with insulin Lantus 25 units.  We discharged patient on 30  units of insulin but may need to increase the dose based on the monitor  readings.   CONSULTATIONS:  None.   PROCEDURE  PERFORMED:  On June 21, 2008, abdominal x-ray, negative  acute abdominal series, no worrisome calcifications or bony findings,  normal heart size and mediastinal shadows, lungs appear clear, no free  air.  February 25, chest x-ray:  The patient has a right PICC line in  place, no pneumothorax, lungs are clear, heart size is normal, no  pleural effusions or focal abnormalities.   BRIEF ADMITTING HISTORY OF PRESENT ILLNESS:  The patient is a 40-year-  old African-American female with type 1 diabetes insulin dependent and  poor medical compliance who presents with acute onset of nausea and  vomiting that started 1 day prior to admission.  She reports having good  blood glucose control with insulin.  She decided to go to [facility] ED  with complaint of nausea and vomiting and abdominal pain and in [facility] was found to have glucose over 600.  She denies fever, cough,  chest pain, syncope, loss of consciousness, urinary symptoms, diarrhea.  No abdominal pain.  No lower extremity swelling.  No recent  traveling.  No sick contacts.  Her vomiting is described as bilious.  It is  associated with retrosternal burning.  It does not radiate, and it is  not related to exertion or food.   VITAL SIGNS:  On admission, temperature 97.6, blood pressure 126/75,  pulse 95, respirations 18, saturating 100% on room air.   PHYSICAL EXAMINATION:  The patient is diaphoretic and retching.  HEENT:  Eyes:  PERRLA, EOMI, 4 mm, nonicteric sclerae.  Dry mucous  membrane.  Tongue piercing noted.  NECK:  Supple.  Full range of motion.  No lymphadenopathy.  LUNGS:  Clear to auscultation bilaterally with no rhonchi, rales, or  wheezing.  CARDIOVASCULAR SYSTEM:  S1-S2 present.  Regular rate and rhythm.  ABDOMEN:  Soft, nontender.  No hepatosplenomegaly.  Negative Murphy's  sign.  Negative for guarding or rigidity.  EXTREMITIES:  Multiple tattoos.  No edema or rashes.  MUSCULOSKELETAL SYSTEM:  Full range of  motion.  No redness, joint  effusion.  NEUROLOGICAL:  Alert and oriented x3.  Cranial nerves II-XII intact.  No  focal deficits.  PSYCHOLOGIC:  Does not appear to be anxious or depressed.   LABORATORY DATA:  Sodium 131, potassium 5, chloride 99, bicarbonate 14,  BUN 14, creatinine 1, glucose 576. WBC 13.2, hemoglobin 13.2, platelets  269, ANC 11.4, MCV 89.  Alkaline phosphatase 121, AST 18, ALT 24,  protein 8.3, calcium 9.9.   HOSPITAL COURSE BY PROBLEM:  1. Diabetic ketoacidosis.  The patient was admitted with CBG over 600,      anion gap on admission 17.  By next morning, anion gap decreased to      9 and has fluctuated between 9 and 15 throughout day 1 of      hospitalization.  The patient was started on insulin drip and was      given IV fluids.  Electrolyte panel showed depletion of potassium,      magnesium and phosphate.  All of the electrolytes were repleted and      eventually were within normal limits on discharge except phosphate      slightly low (2).  During the hospitalization, CBGs remained      between 250-270 with 25 units of insulin Lantus.  On discharge, the      dose was increased to 30 units.  This will be followed up in      outpatient clinic, and additional increase in insulin might be      needed depending on the glucose control.  I suspect low compliance      in this patient which likely precipitated this event of DKA, and      based on hemoglobin A1c of 11.1, her home regimen is insulin Lantus      30 units at bedtime and insulin NovoLog 5-6 units 3 times per day      with meals.  Will continue the same regimen on discharge and follow      up in outpatient clinic.  On discharge, the patient was clinically      stable.  Electrolytes again were within normal limits except low      phosphate of 2.  2. Low phosphate and magnesium, per #1.  Both electrolytes repleted.      In outpatient clinic will be rechecked and repleted as necessary.  3. Nausea, vomiting and  abdominal pain - likely secondary to #1.      Abdominal x-ray series was negative for acute findings.  Chest x-  ray was within normal limits.  The patient remained afebrile during      the hospitalization.  No nausea, vomiting, or abdominal pain on      discharge.  4. Polysubstance abuse, cocaine and THC.  Cocaine detected in urine.      Patient consulted and denies cocaine use.  Cardiac enzymes were      checked and were within normal limits.  EKG did not show any      specific findings, normal sinus rhythm at 89 beats per minute.  T-      wave inversion in V2 lead only, otherwise normal.   VITAL SIGNS:  On discharge, temperature 98.9, blood pressure 123/81,  pulse 84, respirations 17, saturating 97% on room air.   LABORATORY DATA:  Sodium 131, potassium 3.5, chloride 104, bicarbonate  23, BUN 7, creatinine 0.57, glucose 246.  Albumin 2.6, calcium 8.2,  phosphorus 2.0.  WBC 5.7, hemoglobin 9.6, platelets 176.   Over 30 minutes was spent on the discharge.      Mliss Sax, MD  Electronically Signed      Acey Lav, MD  Electronically Signed    IM/MEDQ  D:  06/24/2008  T:  06/24/2008  Job:  [id number]

## 2010-09-10 NOTE — Discharge Summary (Signed)
Robin [name], Robin [name] NO.:  [id number]   MEDICAL RECORD NO.:  [id number]          PATIENT TYPE:  INP   LOCATION:  6732                         FACILITY:  [facility]   PHYSICIAN:  Marinda Elk, M.D.DATE OF BIRTH:  1971/01/06   DATE OF ADMISSION:  10/19/2006  DATE OF DISCHARGE:  10/22/2006                               DISCHARGE SUMMARY   DISCHARGE DIAGNOSES:  1. Diabetic ketoacidosis.  2. Esophagitis.  3. Diabetes type 1.   DISCHARGE MEDICATIONS:  1. Insulin 70/30, thirty units in the morning, 50 units in the evening      q.daily.  2. Ferrous sulfate 325 p.o. b.i.d.  3. Omeprazole 40 mg p.o. daily for four weeks.   DISPOSITION:  Patient will follow up on July 11 at [facility] at 11:30; she will see Dr. Radonna Ricker here.  Her insulin regimen will  be adjusted as needed.  Patient is supposed to bring a log with her  CBGs.  Also will follow up on her iron studies.  Also will check a CBC  and a B-Met, especially for her bicarb, that will be drawn on the day of  the clinic and will also refer to [facility].   PROCEDURES:  None.   CONSULTANTS:  None.   BRIEF ADMITTING HISTORY AND PHYSICAL:  This is a 40 year old with past  medical history of diabetes type 1, presents within a few hours of  nausea and vomiting.  She has had six episodes of nonbloody emesis with  generalized weakness.  Also reports burning substernal chest pain  without radiation that is worse with emesis and swallowing associated  with shortness of breath.  Denies fevers, chills, alcohol abuse or any  recreational drugs.  Patient relates that she has been taking her  insulin religiously.  On the day of admission, her vital signs were  temperature 97.3, heart rate 103, respiration 16, blood pressure 93/65,  saturation 100% on room air.   LABS:  Sodium 131, potassium 5.3, chloride 100, bicarb 6, BUN 21,  creatinine 1.4, glucose 485, anion gap of 25, bilirubin 1.8, alkaline  phosphatase 121, AST 20, ALT 20, protein 8.3, albumin 3.9, calcium 9.4.  ABG:  pH 6.99, PCO2 13.3, PO2 129, bicarb 3.2.  White blood cells 24.4,  hemoglobin 13.3, platelets 334, ANC 22.4, MCV 86.6.  UA shows moderate  blood and ketones.  Urine pregnancy test was negative.  UA was 1027, pH  of 5.5, glucose more than 100, BUN negative, ketones 80, protein 30 and  nitrites negative, and also leukocyte esterase; it showed 0-2 white  blood cells and a few bacteria.  UDS was negative and alcohol was  negative.  Amylase was 60, lipase was 18.  GGT was 21.  PT was 15.3, PTT  23, INR 1.2.  Cardiac enzymes were negative x3.   Chest x-ray showed no acute processes.   HOSPITAL COURSE:  1. DKA triggered by noncompliance of her insulin, also it was      considered infection, MI and intoxication.  Pregnancy was ruled      out.  Cardiac enzymes were obtained which are negative.  Blood      cultures were obtained which were negative.  She was hydrated      aggressively in the ED and once on the floor with normal saline.      She was changed to a normal saline, D-5 half-normal and she was      started on the insulin drip.  Her anion gap corrected within six      hours and her bicarb remained less than 20 until the third hospital      day.  Attempt to increase her bicarb was made she was changed to D-      10 and normal saline.  The patient experienced several episodes of      glucose below 80, but was asymptomatic.  She began taking solids      and so we transitioned her to subcutaneous.  At this time, her      bicarb was 18.  By the next morning, her bicarb was more than 20 so      patient was discharged on Novolin and to follow up in the      outpatient clinic.  2. Burning chest pain.  This was most likely to emesis.  Patient      complained of pain with swallowing solids, however pain resolved by      the third hospital day.  She was placed on Protonix and it resolved      with a GI cocktail.  3.  Acute renal failure.  This was thought to be secondarily to      decreased p.o. intake and her vomiting.  On admission, her initial      BUN and creatinine were elevated.  We hydrated her aggressively and      they fell to within normal limits.  4. Leukocytosis.  This was most likely secondary to stress, the DKA      has no source of infection, and the patient remained afebrile      throughout her hospital stay.  This resolved before discharge.  5. Hypotension.  On admission, patient's blood pressure resolved.      This was thought to be most likely secondarily to acidemia      depressing her cardiac contraction.  This resolved once the DKA      resolved.  6. Hypokalemia.  This was thought to be her excessive diuresis for      DKA.  Several B-Mets were obtained and potassium was repleted as      needed.  We also checked hematocrit and it was repleted as needed.      On the day of discharge, her potassium was within normal limits.  7. Anemia.  Her hemoglobin on admission was 13, by the second day      after hydration it was 10.4.  This was thought to be dilutional.      She is a menstruating female so this was not likely due to meds so      she was started on ferrous sulfate, and iron studies are still      pending on the day of discharge.  This will be followed up as an      outpatient.   DISCHARGE LABS:  On day of discharge, her hemoglobin A1c was 12.9,  sodium 137, potassium 4.1, chloride 110, bicarb 21, BUN 4, creatinine  0.5, glucose 200, calcium 9.2, white blood cells 6.7, hemoglobin 10.4,  hematocrit 31, platelets 239.   VITALS ON THE DAY OF DISCHARGE:  Temperature 97.9, heart rate 78,  respiration 18, blood pressure 95/63, she was sating 100% on room air.      Marinda Elk, M.D.  Electronically Signed     AF/MEDQ  D:  10/29/2006  T:  10/29/2006  Job:  [id number]

## 2010-09-13 NOTE — Op Note (Signed)
Robin [name], CREASEY NO.:  [id number]   MEDICAL RECORD NO.:  [id number]          PATIENT TYPE:  EMS   LOCATION:  MAJO                         FACILITY:  [facility]   PHYSICIAN:  Vanita Panda. Magnus Ivan, M.D.DATE OF BIRTH:  [birth year]   DATE OF PROCEDURE:  04/29/2004  DATE OF DISCHARGE:  04/29/2004                                 OPERATIVE REPORT   PREOPERATIVE DIAGNOSIS:  Left long finger felon.   OPERATION/PROCEDURE:  Irrigation and debridement of left long finger felon.   SURGEON:  Vanita Panda. Magnus Ivan, M.D.   ANESTHESIA:  Digital nerve block with 1% plain lidocaine.   ESTIMATED BLOOD LOSS:  Minimal.   COMPLICATIONS:  None.   FINDINGS:  Gross purulence at the fingertip of the left middle finger.   INDICATIONS:  Briefly, [name] is a 40 year old diabetic female who  three to four days ago was seen in the emergency department for what was  felt to be a paronychia of a her left middle finger.  The paronychia was  drained by the ER staff and she was sent home on Keflex.  After worsening of  her pain in the finger after three to four days, as well as increased  swelling, she came back to the emergency room for further evaluation and  treatment.  The orthopedics was consulted to address worsening infection.  On examination, she did show evidence of a felon of her left long fingertip.  She has had no evidence of flexor tendon synovitis but did appear to have  purulence under pressure at the fingertip.   DESCRIPTION OF PROCEDURE:  After informed consent was obtained, her left  hand was prepped with Betadine in its entirety.  A digital nerve block was  obtained of the middle finger using 1% plain lidocaine.  An incision was  first made on the radial aspect of the tip of the long finger.  Gross  purulence was encountered in this area and a mosquito hemostat was inserted  to release some of the septa.  An instrument was then used to free up tissue  off of  the radial aspect of the fingernail and gross purulence was  encountered in this area as well.  Next, incision was made along the ulnar  aspect of the long finger.  There was no gross purulence encountered in this  area but the tissue was spread to release further septa.  Both wounds were  then copiously irrigated with normal saline solution.  A Penrose drain was  then placed in each finger wound and sterile dressings were applied.  The  patient was given a dose of IV morphine 4 mg as well as a dose of IV Unasyn  1.5 g.   She was discharged from the emergency room in stable condition.  She was  given specific instructions to begin Dial soap soaks on that finger  beginning tomorrow twice daily.  She was given Augmentin 875 mg to take  starting twice a day.  Followup was established in the orthopedic clinic at  [facility] in two to four days.  Again she tolerated the procedure  well without  complications.       CYB/MEDQ  D:  04/29/2004  T:  04/29/2004  Job:  [id number]

## 2010-09-13 NOTE — H&P (Signed)
NAME:  Robin [name], Robin [name]                     ACCOUNT NO.:  [id number]   MEDICAL RECORD NO.:  [id number]                   PATIENT TYPE:  INP   LOCATION:  0484                                 FACILITY:  [facility]   PHYSICIAN:  Hollice Espy, M.D.            DATE OF BIRTH:  13-Nov-1970   DATE OF ADMISSION:  11/16/2003  DATE OF DISCHARGE:                                HISTORY & PHYSICAL   PRIMARY CARE PHYSICIAN:  Is at [facility].   CHIEF COMPLAINT:  Nausea and vomiting.   HISTORY OF PRESENT ILLNESS:  This is a 40 year old African-American female  with a past medical history of diabetes type 1 on Insulin who presents to  the emergency room with several days of nausea and vomiting and found to be  hyperglycemic.  The patient tells me relatively she has been previously  well.  She was diagnosed with diabetes a few years back and she was  hospitalized for high sugars back in April.  Since that time she tells me  her sugars have been relatively stable with no problems.  She began her  period approximately yesterday, but starting yesterday afternoon she has had  some problems with worsening abdominal pain and nausea and vomiting.  She  states she has been unable to keep anything p.o. including liquids as she  immediately throws it right back up.  She denies any other problems; she  tells me her bowel moments are regular and she has no hemoptysis.  She came  in to the emergency room and was noted to have a blood sugar of 227.  Other  lab work was checked and patient was noted to have a small amount of acetone  in her blood.  Her white count was 14.1 thousand with an 86% shift.  Her liver function  tests were within normal limits; urine pregnancy was negative, and lipase  was 17.  A blood gas has been sent on the patient after it was noted her  bicarb was only 14, and this is pending.  The patient has been given IV fluids and IV Phenergan.  Currently she states  she is still not feeling  well, she is complaining of pain in her abdomen and  chest, and she feels very, very dehydrated.  She denies any other problems  such as visual changes, dysphagia, palpitations, shortness of breath,  wheeze, cough, hematuria, dysuria, constipation or diarrhea.  She says  overall she feels quite fatigued.  The patient was given Zofran and  Morphine.   PAST MEDICAL HISTORY:  Diabetes mellitus type 1.  Patient tells me that she  has had no problems with retinopathy, nephropathy, or neuropathy.   MEDICATIONS:  Insulin 70/30, she takes 40 units in the a.m. and 27 units in  the p.m.   ALLERGIES:  She has No known drug allergies.   SOCIAL HISTORY:  She tells me she gave up drinking and smoking a few weeks  back. No drug use.   FAMILY HISTORY:  Noncontributory.   PHYSICAL EXAMINATION:  VITAL SIGNS:  On admission temperature 98.2, heart  rate 89, blood pressure 119/67, respirations 18, her temperature has  remained afebrile.  GENERAL:  She appears to be alert and oriented, in mild general distress  secondary to pain and nausea.  HEENT:  Normocephalic, atraumatic.  Her mucous membranes are dry.  NECK:  She had no carotid bruits.  HEART: Regular rate and rhythm.  LUNGS:  Clear to auscultation bilaterally.  ABDOMEN:  Soft, nontender, nondistended, positive bowel sounds.  EXTREMITIES:  No clubbing, cyanosis or edema.   LABORATORY DATA:  Sodium 132, potassium 4, chloride 108, bicarb 14, BUN 10,  creatinine 0.8, glucose of 227, calcium 9.  Urinalysis is noted to have  greater than 1000 glucose, large amount of hemoglobin, small bilirubin,  greater than 80 ketones, total protein is 30, 21-50 red cells, 2 bacteria.  Small amount of acetone is in her blood.  White count is 14.1 thousand, H&H  12.4/38 with MCV of 89, platelet count of 311,000 with an 86% shift.  Her  liver function tests are noted to have an albumin of 3.3, a total bilirubin  of 1.3, the rest of her LFTs are normal; lipase level  is 17.  Urine  pregnancy test is negative.  ABG is pending.   ASSESSMENT AND PLAN:  1. Hyperglycemia and diabetes type 1.  The etiology I suspect is either the     abdominal pain possible from a viral gastroenteritis versus another     source of infection.  Her urine appears to be mostly evidence of     dehydration plus her period, but I do not see that as a source.  Will go     ahead and check a KUB, control with IV fluids, and give a sliding scale.   1. Hematuria is likely secondary to her period which has just started.  Will     await the patient's ABG and see if she is indeed acidotic. This may     change her course for treatment  and if so an addendum to this history     and physical will be dictated.                                               Hollice Espy, M.D.    SKK/MEDQ  D:  11/17/2003  T:  11/17/2003  Job:  [id number]

## 2010-09-13 NOTE — H&P (Signed)
NAMEAFRA, TRICARICO           ACCOUNT NO.:  [id number]   MEDICAL RECORD NO.:  [id number]          PATIENT TYPE:  INP   LOCATION:  1312                         FACILITY:  [facility]   PHYSICIAN:  Kela Millin, M.D.DATE OF BIRTH:  1970-12-14   DATE OF ADMISSION:  04/06/2006  DATE OF DISCHARGE:                              HISTORY & PHYSICAL   PRIMARY CARE PHYSICIAN:  Chiropodist ([facility]).   CHIEF COMPLAINT:  Persistent nausea and vomiting.   HISTORY OF PRESENT ILLNESS:  The patient is a 40 year old black female  with a past medical history significant for diabetes mellitus, who  presents with complaints of persistent nausea and vomiting x1 day.  She  denies abdominal pain and also denies fevers, dysuria, melena,  hematochezia, hematemesis, and also no chest pain.   The patient was seen in the ER and lab work revealed a blood glucose of  532.  Urinalysis showed a urine glucose greater than 1000 with urine  ketones, and her CO2 11.  She is admitted to the [facility] for further evaluation and management.   PAST MEDICAL HISTORY:  As stated above.   MEDICATIONS:  Insulin 70/30 40 units q.a.m. and 25 units q.p.m.   ALLERGIES:  No known drug allergies.   SOCIAL HISTORY:  She admits to smoking two cigarettes per week, she  denies alcohol.   FAMILY HISTORY:  Her grandmother has diabetes.   REVIEW OF SYSTEMS:  As per HPI. Other review of systems negative.   PHYSICAL EXAMINATION:  GENERAL:  The patient is a middle-aged black  female.  She is sitting up in the bed rocking back and forth with an  emesis basin close to her.  In no respiratory distress.  VITAL SIGNS:  Her temperature is 97.4, blood pressure 110/70, pulse 106,  respiratory rate 28, O2 saturation of 98%.  HEENT:  PERRL, EOMI, sclerae anicteric, dry mucous membranes and no oral  exudates.  NECK:  Supple, no adenopathy, no thyromegaly.  LUNGS:  Clear to auscultation bilaterally.  No crackles or  wheezes.  CARDIOVASCULAR:  Regular rate and rhythm with normal S1 and S2.  ABDOMEN:  Soft, bowel sounds present and nontender.  Nondistended.  No  organomegaly and no masses palpable.  EXTREMITIES:  No cyanosis and no edema.  NEUROLOGY:  Alert and oriented x3.  Cranial nerves II-XII grossly  intact.  Nonfocal examination.   LABORATORY DATA:  Sodium 133, potassium 5.0, chloride 99, CO2 11,  glucose 532, BUN 16, creatinine 1.2, calcium 10.3.  Urinalysis shows a  specific gravity of 1.029, urine pH 5.5, urine glucose greater than  1000, urine ketones greater than 80, and urine nitrite negative.  Leukocyte esterase is negative.  Rare bacteria.   ASSESSMENT:  1. Diabetic ketoacidosis.  As discussed above, we will aggressively      hydrate with IV fluids, IV per Glucomander protocol.  Monitor      BMET's.  Follow and resume 70/30 when off the Glucomander and      adjust dose as appropriate for adequate blood glucose control.  2. Volume depletion.  Likely cause of diabetic ketoacidosis.  Hydrate      and follow.      Kela Millin, M.D.  Electronically Signed     ACV/MEDQ  D:  04/07/2006  T:  04/07/2006  Job:  [id number]   cc:   [facility]

## 2010-09-13 NOTE — Discharge Summary (Signed)
NAME:  Robin [name], Robin [name]                     ACCOUNT NO.:  [id number]   MEDICAL RECORD NO.:  [id number]                   PATIENT TYPE:  INP   LOCATION:  5507                                 FACILITY:  [facility]   PHYSICIAN:  Dante Gang, M.D.               DATE OF BIRTH:  1971-03-20   DATE OF ADMISSION:  12/30/2001  DATE OF DISCHARGE:  01/01/2002                                 DISCHARGE SUMMARY   PROBLEM LIST:  1. Diabetes mellitus type 1 with mild diabetic ketoacidosis.  2. Nausea and vomiting secondary to #1.  3. Urinary tract infection/pyelonephritis.  4. History of depression currently on no treatment   MEDICATIONS AT DISCHARGE:  1. Insulin 70/30 - 18 units q.a.m. and 8 units q.p.m.  2. Ciprofloxacin 500 mg twice a day times five days.   ADMITTING HISTORY:  The patient is a 40 year-old African-American female  with a history significant for the above list of problems who was admitted  secondary to two days of persistent nausea and vomiting.  She was doing well  until the morning of September 2 when she started crying for no reason.  She  felt bad when she went to work on September 3 and went home after only 30  minutes.  In the mid morning on Wednesday she began having nausea and  vomiting. She attempted to eat a corn dog, but vomited this up as well.  She  describes the vomitus as mostly yellow; no green content or blood.  She has  had some fever and chills, nocturia, malaise and diffuse pain.   PHYSICAL EXAMINATION ON ADMISSION:  VITAL SIGNS:  Temperature 98.8,  respiratory rate 18, pulse 103, blood pressure 129/82.  GENERAL:  This is a 40 year-old African-American female in no acute  distress.  ABDOMEN: Significant findings on physical exam included an abdomen that was  diffusely tender with some pain primarily in the right lower quadrant.  She  had positive bowel sounds but no rebound or  guarding.  EXTREMITIES:  She had 1+ edema in the bilateral lower extremities  and good  distal pulses.  PELVIC EXAM:  Showed no abnormalities.   LABORATORIES ON ADMISSION:  Sodium 137, potassium 3.7, chloride 103, bicarb  23, BUN 3.0, creatinine 0.6, glucose 125 after correction in the emergency  room.  Glucose was 395 on presentation.  White count 7.5, hemoglobin 12.4.  Glucose 205.  Bilirubin 1.0, alkaline phosphatase 89, AST 15, ALT 16,  protein 7.3, albumin 2.9, calcium 9.1. Ketones small in the urine.  UA  showed greater than 1000 glucose, small ketones, large blood, positive  nitrites.   HOSPITAL COURSE BY PROBLEM LIST:  1. Diabetes/diabetic ketoacidosis.  Sugars were high most likely secondary     to a urinary tract infection.  Given her ketones, it was thought she was     going into some mild ketoacidosis.  She was given her usual dose of NPH  as well as sliding scale insulin every four hours.   On September 5 her blood sugars were still elevated at 188 and 182, but she  was improving.  She was hydrated aggressively and still felt somewhat ill,  but much better than the day before.  As of September 6 she was feeling much  better and was anxious to go home.  Her laboratories showed a sodium of 134,  a chloride of 106 and a bicarb of 21 for a normal anion gap.  She is  discharged home on her previous dose of NPH insulin with explicit  instructions to check a blood sugar with meals and before bed, to write  those numbers down and to bring them with her to her follow-up clinic visit.   1. Urinary tract infection/pyelonephritis.  She was started on Cipro on     admission given her positive UA.  Urine cultures were performed, but were     not back as of the time of discharge.  Blood cultures showed gram     negative rods in one of two bottles and no growth in the other bottle.     Given her remarkable clinical improvement, she is discharged home with     close follow-up.  She was placed on seven days of Cipro 500 mg b.i.d. as     this is the treatment  regimen recommended by [facility].   FOLLOW UP:  The patient was to follow-up with Dr. Merlinda Frederick in the  outpatient clinic.  Dr. Merlinda Frederick will call her on Monday with an  appointment time.  At that visit her blood sugar should be evaluated and her  insulin regimen adjusted as necessary.  She should also have a BMET checked  and her urinary symptoms should be evaluated.                                                Dante Gang, M.D.    JP/MEDQ  D:  01/01/2002  T:  01/03/2002  Job:  [id number]

## 2010-09-13 NOTE — Discharge Summary (Signed)
NAME:  Robin [name], Robin [name]                     ACCOUNT NO.:  [id number]   MEDICAL RECORD NO.:  [id number]                   PATIENT TYPE:  INP   LOCATION:  0379                                 [facility]AC[facility]   PHYSICIAN:  Hollice Espy, M.D.            DATE OF BIRTH:  1971/01/19   DATE OF ADMISSION:  08/16/2003  DATE OF DISCHARGE:  08/17/2003                                 DISCHARGE SUMMARY   PRIMARY CARE PHYSICIAN:  The patient has a primary care physician, but she  cannot remember her name now.   DISCHARGE DIAGNOSES:  1. Non-ketotic hyperglycemia.  2. Type 1 diabetes.  3. Dehydration secondary to #1.  4. Medical noncompliance.   DISCHARGE MEDICATIONS:  Insulin 70/30; the patient takes 40 units in the  morning and 25 units in the evening.   HISTORY OF PRESENT ILLNESS:  This is a 40 year old white female with a past  medical history of diabetes, type 1, whose insulin ran out a few days prior  to coming in. She has also had difficulty paying for her medication and went  a couple of days without taking it. When she started feeling ill, having  nausea and vomiting, and felt weak, she came to the emergency room and was  found to have a blood sugar in the 500s. She had no evidence of an anion  gap, although serum acetone was slightly elevated. She was admitted, put on  IV fluids, started on an insulin drip.   HOSPITAL COURSE:   PROBLEM #1:  In regards to patient's diabetes and hyperglycemia, by the  second day the patient was feeling much better. Her volume status was  replete with IV fluids and her blood sugars had come down to normal to below  200. The patient was feeling fine, tolerating p.o.  I advised her that  should she ever run out of insulin and not have any money to afford it to at  least come into an emergency room where she may get some emergency insulin.  She told me that she would indeed. I also advised her to follow up with her  primary care physician.   DIET:  Diabetic diet.   ACTIVITY:  As tolerated.   DISPOSITION:  Improved. She is being discharged to home.                                               Hollice Espy, M.D.    SKK/MEDQ  D:  09/01/2003  T:  09/02/2003  Job:  [id number]

## 2010-09-13 NOTE — H&P (Signed)
NAME:  Robin [name], Robin [name]                     ACCOUNT NO.:  [id number]   MEDICAL RECORD NO.:  [id number]                   PATIENT TYPE:  INP   LOCATION:  0101                                 FACILITY:  [facility]   PHYSICIAN:  Corinna L. Lendell Caprice, MD             DATE OF BIRTH:  22-Jul-1970   DATE OF ADMISSION:  08/16/2003  DATE OF DISCHARGE:                                HISTORY & PHYSICAL   CHIEF COMPLAINT:  Vomiting.   HISTORY OF PRESENT ILLNESS:  Robin [name] is a 40 year old black female with  insulin-dependent diabetes, who presents to the emergency room with vomiting  for the past two days.  She ran out of insulin and could not afford to buy  more two days ago.  Since then she has also developed cough productive of  greenish sputum.  She has no shortness of breath.  She has not had fevers  but feels sweaty a lot.  She has had polyuria and polydipsia.  No dysuria.   PAST MEDICAL HISTORY:  As above.   MEDICATIONS:  She usually takes 70/30 insulin, 40 units subcutaneous in the  morning, 25 units subcutaneous in the evening.   SOCIAL HISTORY:  The patient works as a Psychologist, occupational.  She smokes a  cigarette every once in awhile.  She does not drink, no drugs.   FAMILY HISTORY:  Her grandmother had diabetes.   REVIEW OF SYSTEMS:  As above, otherwise negative.   PHYSICAL EXAMINATION:  VITAL SIGNS:  Her temperature is 97.1, heart rate 88,  respiratory rate 18, blood pressure 115/80.  GENERAL:  In general the patient is a somewhat weak-appearing black female  in no acute distress.  HEENT:  Normocephalic, atraumatic.  Pupils equal, round, and reactive to  light.  Sclerae nonicteric.  Slightly dry mucous membranes.  NECK:  Supple.  No lymphadenopathy.  LUNGS:  Clear to auscultation bilaterally without wheezes, rhonchi, or  rales.  CARDIOVASCULAR:  Regular rate and rhythm without murmurs, gallops, or rubs.  ABDOMEN:  Normal bowel sounds, soft, nontender, nondistended.  GENITOURINARY/RECTAL:  Deferred.  EXTREMITIES:  No clubbing, cyanosis, or edema.  NEUROLOGIC:  The patient is somewhat groggy but oriented and cranial nerves  and sensory motor exam are intact.  PSYCHIATRIC:  Normal affect.   LABORATORY DATA:  Sodium is 126, potassium 4.5, chloride 98, bicarbonate 16,  glucose 521, BUN 13, creatinine 0.9.  Moderate blood acetone.  Anion gap is  12.  UA reveals greater than 1000 glucose, greater than 80 ketones, negative  nitrites, negative leukocyte esterase.   ED COURSE:  The patient has received 10 units of IV insulin and several  liters of normal saline.   ASSESSMENT/PLAN:  1/  Mild diabetic ketoacidosis, secondary to lack of  insulin.  She has had a repeat BMET which shows bicarbonate of 17, but her  anion gap has closed to 6.  I feel at this time we do not need to start  an  insulin drip, but rather give her 70/30 insulin tonight, continue the IV  fluids, check another BMET in the morning and if still with normal anion  gap, she can go home.  1. Dehydration.  See above.                                               Corinna L. Lendell Caprice, MD    CLS/MEDQ  D:  08/16/2003  T:  08/16/2003  Job:  [id number]   cc:   Dala Dock

## 2010-09-13 NOTE — Discharge Summary (Signed)
[name], [name]           ACCOUNT NO.:  [id number]   MEDICAL RECORD NO.:  [id number]          PATIENT TYPE:  INP   LOCATION:  1312                         FACILITY:  [name]   PHYSICIAN:  Robin [name], M.D.DATE OF BIRTH:  Feb 22, 1971   DATE OF ADMISSION:  04/06/2006  DATE OF DISCHARGE:  04/09/2006                               DISCHARGE SUMMARY   DISCHARGE DIAGNOSES:  1. Diabetic ketoacidosis.  2. Volume depletion.   HISTORY:  The patient is a 40 year old black female with past medical  history significant for diabetes mellitus, who presented with complaints  of persistent nausea and vomiting x1 day.  She denied abdominal pain,  fevers, dysuria, melena, hematochezia, hematemesis.  Also denied chest  pain.  In the ER, her lab work revealed a blood glucose of 532.  Urinalysis showed urine glucose greater than 1000 with urine ketones and  a CO2 11.  She was admitted to the [name] service for their  evaluation and management.   PHYSICAL EXAMINATION UPON ADMISSION:  VITAL SIGNS:  Temperature 97.4  with a blood pressure of 110/70, pulse 106, respiratory rate 28, O2  saturation of 98%.  Pertinent findings on exam:  HEENT:  Dry mucous membranes.  ABDOMEN:  Soft, bowel sounds present, nontender, nondistended.  No  organomegaly and no masses palpable.  The rest of her physical exam was  noted to be within normal limits.   LABORATORY DATA:  Sodium 133 with a potassium of 5.0, chloride 99, CO2  11, blood glucose 532, BUN 16, creatinine 1.2, calcium 10.3.  Urinalysis  as above.   [name] COURSE:  Diabetic ketoacidosis:  The patient was initially  placed on IV insulin via Glucomander protocol.  She was aggressively  hydrated with IV fluids.  She was subsequently restarted on her 70/30  subcu after she made criteria for discontinuation of the IV insulin per  Glucomander.  Her [name] stay was complicated by hypoglycemic episodes  on the subcu 70/30 - her outpatient  dose was thus adjusted, and she was  monitored for adequate control without hypoglycemia.  Her nausea and  vomiting resolved as she was tolerating a p.o. diet well.  The 70/30 was  adjusted to 30 units in the morning (from 40 previously and her p.m.  adjusted to 15 units in p.m.) from 25 units previously.  The patient was  to follow up with her primary care physician at [name].   DISCHARGE MEDICATIONS:  Insulin 70/30, 30 units q.a.m. and 15 units  q.p.m.  The patient was also instructed per discharging physician that  if her blood sugars consistently sustain greater than 200, she was to  increase her dose back to the previous dose of 40 in a.m. and 25 in p.m.   FOLLOWUP CARE:  [facility] the week after discharge.      Robin [name], M.D.  Electronically Signed     ACV/MEDQ  D:  06/04/2006  T:  06/04/2006  Job:  [id number]   cc:   Dala Dock
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
## 2011-01-24 LAB — DIFFERENTIAL
Basophils Absolute: 0
Lymphocytes Relative: 21
Monocytes Absolute: 0.4
Neutro Abs: 9.5 — ABNORMAL HIGH
Neutrophils Relative %: 75

## 2011-01-24 LAB — BASIC METABOLIC PANEL
Calcium: 9.4
Creatinine, Ser: 0.65
GFR calc Af Amer: >60
GFR calc non Af Amer: >60
Glucose, Bld: 53 — ABNORMAL LOW
Sodium: 135

## 2011-01-24 LAB — GLUCOSE, CAPILLARY
Glucose-Capillary: 125 — ABNORMAL HIGH
Glucose-Capillary: 47 — ABNORMAL LOW
Glucose-Capillary: 77

## 2011-01-24 LAB — URINE CULTURE: Colony Count: >=100000

## 2011-01-24 LAB — CBC
Hemoglobin: 11.7 — ABNORMAL LOW
RDW: 14.8

## 2011-01-28 LAB — GLUCOSE, CAPILLARY: Glucose-Capillary: 345 — ABNORMAL HIGH

## 2011-02-12 LAB — POCT I-STAT 3, ART BLOOD GAS (G3+)
Acid-base deficit: 26 — ABNORMAL HIGH
O2 Saturation: 97
Operator id: 281201
Patient temperature: 98.7
TCO2: <5
pCO2 arterial: 13.2 — CL
pCO2 arterial: 14 — CL
pH, Arterial: 6.998 — CL

## 2011-02-12 LAB — BASIC METABOLIC PANEL
BUN: 15
BUN: 2 — ABNORMAL LOW
BUN: 2 — ABNORMAL LOW
BUN: 5 — ABNORMAL LOW
BUN: 8
BUN: <1 — ABNORMAL LOW
BUN: <1 — ABNORMAL LOW
CO2: 10 — ABNORMAL LOW
CO2: 14 — ABNORMAL LOW
CO2: 17 — ABNORMAL LOW
CO2: 17 — ABNORMAL LOW
CO2: 8 — CL
Calcium: 7.6 — ABNORMAL LOW
Calcium: 7.6 — ABNORMAL LOW
Calcium: 7.7 — ABNORMAL LOW
Calcium: 7.7 — ABNORMAL LOW
Calcium: 7.8 — ABNORMAL LOW
Calcium: 8 — ABNORMAL LOW
Calcium: 8.3 — ABNORMAL LOW
Calcium: 9.2
Chloride: 109
Chloride: 112
Chloride: 115 — ABNORMAL HIGH
Chloride: 119 — ABNORMAL HIGH
Creatinine, Ser: 0.44
Creatinine, Ser: 0.45
Creatinine, Ser: 0.53
Creatinine, Ser: 0.53
Creatinine, Ser: 0.54
Creatinine, Ser: 0.56
Creatinine, Ser: 1.4 — ABNORMAL HIGH
GFR calc Af Amer: >60
GFR calc Af Amer: >60
GFR calc Af Amer: >60
GFR calc Af Amer: >60
GFR calc Af Amer: >60
GFR calc Af Amer: >60
GFR calc Af Amer: >60
GFR calc Af Amer: >60
GFR calc Af Amer: >60
GFR calc non Af Amer: >60
GFR calc non Af Amer: >60
GFR calc non Af Amer: >60
GFR calc non Af Amer: >60
GFR calc non Af Amer: >60
GFR calc non Af Amer: >60
GFR calc non Af Amer: >60
GFR calc non Af Amer: >60
GFR calc non Af Amer: >60
Glucose, Bld: 139 — ABNORMAL HIGH
Glucose, Bld: 145 — ABNORMAL HIGH
Glucose, Bld: 157 — ABNORMAL HIGH
Glucose, Bld: 280 — ABNORMAL HIGH
Glucose, Bld: 75
Potassium: 3.3 — ABNORMAL LOW
Potassium: 3.4 — ABNORMAL LOW
Potassium: 3.7
Potassium: 3.7
Potassium: 3.8
Potassium: 4.1
Sodium: 132 — ABNORMAL LOW
Sodium: 133 — ABNORMAL LOW
Sodium: 135
Sodium: 135
Sodium: 137
Sodium: 138
Sodium: 138
Sodium: 138
Sodium: 140

## 2011-02-12 LAB — URINALYSIS, ROUTINE W REFLEX MICROSCOPIC
Bilirubin Urine: NEGATIVE
Hgb urine dipstick: NEGATIVE
Ketones, ur: >80 — AB
Nitrite: NEGATIVE
Protein, ur: 30 — AB
Urobilinogen, UA: 0.2

## 2011-02-12 LAB — CARDIAC PANEL(CRET KIN+CKTOT+MB+TROPI)
CK, MB: 1.2
Relative Index: INVALID
Total CK: 45
Troponin I: 0.02
Troponin I: 0.03

## 2011-02-12 LAB — CBC
HCT: 31.7 — ABNORMAL LOW
HCT: 42.4
Hemoglobin: 9.7 — ABNORMAL LOW
MCHC: 31.3
MCV: 82.9
MCV: 86.6
Platelets: 239
Platelets: 269
Platelets: 334
RBC: 3.54 — ABNORMAL LOW
RDW: 16.4 — ABNORMAL HIGH
WBC: 11.4 — ABNORMAL HIGH
WBC: 24.4 — ABNORMAL HIGH
WBC: 6.3
WBC: 6.7

## 2011-02-12 LAB — COMPREHENSIVE METABOLIC PANEL
ALT: 12
AST: 11
Albumin: 2.4 — ABNORMAL LOW
Albumin: 3.9
Alkaline Phosphatase: 57
BUN: 21
BUN: 4 — ABNORMAL LOW
CO2: 17 — ABNORMAL LOW
Calcium: 7.9 — ABNORMAL LOW
Chloride: 100
Chloride: 118 — ABNORMAL HIGH
Creatinine, Ser: 0.54
Creatinine, Ser: 1.43 — ABNORMAL HIGH
GFR calc Af Amer: >60
GFR calc non Af Amer: 42 — ABNORMAL LOW
GFR calc non Af Amer: >60
Glucose, Bld: 69 — ABNORMAL LOW
Potassium: 2.9 — ABNORMAL LOW
Sodium: 139
Total Bilirubin: 1.8 — ABNORMAL HIGH
Total Protein: 5.1 — ABNORMAL LOW
Total Protein: 5.4 — ABNORMAL LOW

## 2011-02-12 LAB — DIFFERENTIAL
Basophils Absolute: 0
Basophils Absolute: 0
Eosinophils Relative: 0
Lymphocytes Relative: 15
Lymphocytes Relative: 6 — ABNORMAL LOW
Lymphs Abs: 1.7
Monocytes Absolute: 0.8 — ABNORMAL HIGH
Monocytes Relative: 2 — ABNORMAL LOW
Monocytes Relative: 7
Neutro Abs: 8.9 — ABNORMAL HIGH

## 2011-02-12 LAB — SODIUM, URINE, RANDOM: Sodium, Ur: 69

## 2011-02-12 LAB — TROPONIN I: Troponin I: 0.03

## 2011-02-12 LAB — CULTURE, BLOOD (ROUTINE X 2)

## 2011-02-12 LAB — I-STAT 8, (EC8 V) (CONVERTED LAB)
Acid-base deficit: 23 — ABNORMAL HIGH
Chloride: 103
HCT: 50 — ABNORMAL HIGH
Hemoglobin: 17 — ABNORMAL HIGH
Operator id: 277751
Potassium: 5.4 — ABNORMAL HIGH
Sodium: 127 — ABNORMAL LOW
pCO2, Ven: 21 — ABNORMAL LOW

## 2011-02-12 LAB — CK TOTAL AND CKMB (NOT AT ARMC)
CK, MB: 1.2
Total CK: 46

## 2011-02-12 LAB — IRON: Iron: 173 — ABNORMAL HIGH

## 2011-02-12 LAB — AMYLASE: Amylase: 60

## 2011-02-12 LAB — CREATININE, SERUM: GFR calc Af Amer: 53 — ABNORMAL LOW

## 2011-02-12 LAB — LIPID PANEL
HDL: 31 — ABNORMAL LOW
Total CHOL/HDL Ratio: 3.7
Triglycerides: 26

## 2011-02-12 LAB — HEMOGLOBIN A1C: Hgb A1c MFr Bld: 12.9 — ABNORMAL HIGH

## 2011-02-12 LAB — FERRITIN: Ferritin: 76 (ref 10–291)

## 2011-02-12 LAB — PREGNANCY, URINE: Preg Test, Ur: NEGATIVE

## 2011-02-12 LAB — GAMMA GT: GGT: 21

## 2011-02-12 LAB — PROTIME-INR: INR: 1.2

## 2011-04-02 ENCOUNTER — Encounter: Payer: Self-pay | Admitting: Internal Medicine

## 2011-04-03 ENCOUNTER — Encounter (HOSPITAL_COMMUNITY): Payer: Self-pay | Admitting: Anesthesiology

## 2011-04-03 ENCOUNTER — Encounter (HOSPITAL_COMMUNITY): Payer: Self-pay | Admitting: Emergency Medicine

## 2011-04-03 ENCOUNTER — Inpatient Hospital Stay (HOSPITAL_COMMUNITY)
Admission: EM | Admit: 2011-04-03 | Discharge: 2011-04-06 | DRG: 343 | Disposition: A | Discharge status: Home / Self Care | Payer: Self-pay | Attending: General Surgery | Admitting: General Surgery

## 2011-04-03 ENCOUNTER — Other Ambulatory Visit (INDEPENDENT_AMBULATORY_CARE_PROVIDER_SITE_OTHER): Payer: Self-pay | Admitting: General Surgery

## 2011-04-03 ENCOUNTER — Emergency Department (HOSPITAL_COMMUNITY): Payer: Self-pay

## 2011-04-03 ENCOUNTER — Other Ambulatory Visit: Payer: Self-pay

## 2011-04-03 ENCOUNTER — Emergency Department (HOSPITAL_COMMUNITY): Payer: Self-pay | Admitting: Anesthesiology

## 2011-04-03 ENCOUNTER — Encounter (HOSPITAL_COMMUNITY): Admission: EM | Disposition: A | Discharge status: Home / Self Care | Payer: Self-pay | Source: Home / Self Care

## 2011-04-03 DIAGNOSIS — K219 Gastro-esophageal reflux disease without esophagitis: Secondary | ICD-10-CM | POA: Diagnosis present

## 2011-04-03 DIAGNOSIS — K3580 Unspecified acute appendicitis: Secondary | ICD-10-CM

## 2011-04-03 DIAGNOSIS — R1031 Right lower quadrant pain: Secondary | ICD-10-CM

## 2011-04-03 DIAGNOSIS — E109 Type 1 diabetes mellitus without complications: Secondary | ICD-10-CM | POA: Diagnosis present

## 2011-04-03 DIAGNOSIS — R112 Nausea with vomiting, unspecified: Secondary | ICD-10-CM | POA: Diagnosis not present

## 2011-04-03 DIAGNOSIS — K37 Unspecified appendicitis: Secondary | ICD-10-CM

## 2011-04-03 DIAGNOSIS — M5126 Other intervertebral disc displacement, lumbar region: Secondary | ICD-10-CM | POA: Diagnosis present

## 2011-04-03 DIAGNOSIS — F17200 Nicotine dependence, unspecified, uncomplicated: Secondary | ICD-10-CM | POA: Diagnosis present

## 2011-04-03 DIAGNOSIS — Z794 Long term (current) use of insulin: Secondary | ICD-10-CM

## 2011-04-03 HISTORY — PX: LAPAROSCOPIC APPENDECTOMY: SHX408

## 2011-04-03 HISTORY — DX: Essential (primary) hypertension: I10

## 2011-04-03 LAB — URINALYSIS, ROUTINE W REFLEX MICROSCOPIC
Nitrite: NEGATIVE
Protein, ur: 100 mg/dL — AB
Specific Gravity, Urine: 1.035 — ABNORMAL HIGH (ref 1.005–1.030)
Urobilinogen, UA: 0.2 mg/dL (ref 0.0–1.0)

## 2011-04-03 LAB — DIFFERENTIAL
Basophils Absolute: 0 10*3/uL (ref 0.0–0.1)
Basophils Relative: 0 % (ref 0–1)
Lymphocytes Relative: 13 % (ref 12–46)
Monocytes Absolute: 0.8 10*3/uL (ref 0.1–1.0)
Neutro Abs: 9.7 10*3/uL — ABNORMAL HIGH (ref 1.7–7.7)
Neutrophils Relative %: 80 % — ABNORMAL HIGH (ref 43–77)

## 2011-04-03 LAB — GLUCOSE, CAPILLARY
Glucose-Capillary: 125 mg/dL — ABNORMAL HIGH (ref 70–99)
Glucose-Capillary: 60 mg/dL — ABNORMAL LOW (ref 70–99)
Glucose-Capillary: 61 mg/dL — ABNORMAL LOW (ref 70–99)
Glucose-Capillary: 69 mg/dL — ABNORMAL LOW (ref 70–99)

## 2011-04-03 LAB — POCT I-STAT, CHEM 8
Calcium, Ion: 1.22 mmol/L (ref 1.12–1.32)
Creatinine, Ser: 0.6 mg/dL (ref 0.50–1.10)
Glucose, Bld: 203 mg/dL — ABNORMAL HIGH (ref 70–99)
HCT: 37 % (ref 36.0–46.0)
Hemoglobin: 12.6 g/dL (ref 12.0–15.0)
Potassium: 3.7 mEq/L (ref 3.5–5.1)

## 2011-04-03 LAB — CBC
HCT: 34 % — ABNORMAL LOW (ref 36.0–46.0)
MCHC: 32.6 g/dL (ref 30.0–36.0)
Platelets: 294 10*3/uL (ref 150–400)
RDW: 15.1 % (ref 11.5–15.5)
WBC: 12.2 10*3/uL — ABNORMAL HIGH (ref 4.0–10.5)

## 2011-04-03 LAB — PREGNANCY, URINE: Preg Test, Ur: NEGATIVE

## 2011-04-03 LAB — URINE MICROSCOPIC-ADD ON

## 2011-04-03 LAB — WET PREP, GENITAL: Yeast Wet Prep HPF POC: NONE SEEN

## 2011-04-03 SURGERY — APPENDECTOMY, LAPAROSCOPIC
Anesthesia: General | Site: Abdomen | Wound class: Contaminated

## 2011-04-03 MED ORDER — VECURONIUM BROMIDE 10 MG IV SOLR
INTRAVENOUS | Status: DC | PRN
  Administered 2011-04-03: 5 mg via INTRAVENOUS

## 2011-04-03 MED ORDER — GLUCOSE 40 % PO GEL
1.0000 | Freq: Once | ORAL | Status: AC
  Administered 2011-04-03: 37.5 g via ORAL

## 2011-04-03 MED ORDER — SODIUM CHLORIDE 0.9 % IV BOLUS (SEPSIS)
500.0000 mL | Freq: Once | INTRAVENOUS | Status: AC
  Administered 2011-04-03: 13:00:00 via INTRAVENOUS

## 2011-04-03 MED ORDER — HYDROMORPHONE HCL PF 2 MG/ML IJ SOLN
INTRAMUSCULAR | Status: AC
  Administered 2011-04-03: 1 mg
  Filled 2011-04-03: qty 1

## 2011-04-03 MED ORDER — SUCCINYLCHOLINE CHLORIDE 20 MG/ML IJ SOLN
INTRAMUSCULAR | Status: DC | PRN
  Administered 2011-04-03: 100 mg via INTRAVENOUS

## 2011-04-03 MED ORDER — DIPHENHYDRAMINE HCL 50 MG/ML IJ SOLN
INTRAMUSCULAR | Status: AC
  Administered 2011-04-03: 12.5 mg
  Filled 2011-04-03: qty 1

## 2011-04-03 MED ORDER — SODIUM CHLORIDE 0.9 % IV SOLN
INTRAVENOUS | Status: DC
  Administered 2011-04-03 – 2011-04-06 (×5): via INTRAVENOUS

## 2011-04-03 MED ORDER — HYDROMORPHONE HCL PF 1 MG/ML IJ SOLN
INTRAMUSCULAR | Status: AC
  Administered 2011-04-03: 0.5 mg via INTRAVENOUS
  Filled 2011-04-03: qty 1

## 2011-04-03 MED ORDER — HYDROMORPHONE HCL PF 1 MG/ML IJ SOLN
INTRAMUSCULAR | Status: AC
  Administered 2011-04-03: 0.5 mg
  Filled 2011-04-03: qty 1

## 2011-04-03 MED ORDER — KETOROLAC TROMETHAMINE 30 MG/ML IJ SOLN
INTRAMUSCULAR | Status: AC
  Administered 2011-04-03: 30 mg
  Filled 2011-04-03: qty 1

## 2011-04-03 MED ORDER — ONDANSETRON HCL 4 MG/2ML IJ SOLN
4.0000 mg | Freq: Once | INTRAMUSCULAR | Status: AC
  Administered 2011-04-03 (×2): 4 mg via INTRAVENOUS

## 2011-04-03 MED ORDER — IOHEXOL 300 MG/ML  SOLN
100.0000 mL | Freq: Once | INTRAMUSCULAR | Status: AC | PRN
  Administered 2011-04-03: 100 mL via INTRAVENOUS

## 2011-04-03 MED ORDER — FENTANYL CITRATE 0.05 MG/ML IJ SOLN
INTRAMUSCULAR | Status: DC | PRN
  Administered 2011-04-03: 100 ug via INTRAVENOUS
  Administered 2011-04-03: 50 ug via INTRAVENOUS

## 2011-04-03 MED ORDER — ONDANSETRON HCL 4 MG/2ML IJ SOLN
INTRAMUSCULAR | Status: AC
  Filled 2011-04-03: qty 2

## 2011-04-03 MED ORDER — PROPOFOL 10 MG/ML IV BOLUS
INTRAVENOUS | Status: DC | PRN
  Administered 2011-04-03: 200 mg via INTRAVENOUS

## 2011-04-03 MED ORDER — ONDANSETRON HCL 4 MG/2ML IJ SOLN
INTRAMUSCULAR | Status: AC
  Administered 2011-04-03: 4 mg via INTRAVENOUS
  Filled 2011-04-03: qty 2

## 2011-04-03 MED ORDER — ONDANSETRON HCL 4 MG/2ML IJ SOLN
INTRAMUSCULAR | Status: AC
  Administered 2011-04-03: 4 mg
  Filled 2011-04-03: qty 2

## 2011-04-03 MED ORDER — HYDROMORPHONE HCL PF 2 MG/ML IJ SOLN
INTRAMUSCULAR | Status: AC
  Administered 2011-04-03: 0.5 mg
  Filled 2011-04-03: qty 1

## 2011-04-03 MED ORDER — LORAZEPAM 2 MG/ML IJ SOLN
INTRAMUSCULAR | Status: AC
  Administered 2011-04-03: 0.5 mg
  Filled 2011-04-03: qty 1

## 2011-04-03 MED ORDER — HYDROMORPHONE HCL PF 1 MG/ML IJ SOLN
INTRAMUSCULAR | Status: AC
  Filled 2011-04-03: qty 1

## 2011-04-03 MED ORDER — HYDROMORPHONE HCL PF 1 MG/ML IJ SOLN
1.0000 mg | Freq: Once | INTRAMUSCULAR | Status: AC
Start: 2011-04-03 — End: 2011-04-03
  Administered 2011-04-03: 1 mg via INTRAVENOUS
  Filled 2011-04-03: qty 1

## 2011-04-03 MED ORDER — BUPIVACAINE-EPINEPHRINE 0.25% -1:200000 IJ SOLN
INTRAMUSCULAR | Status: DC | PRN
  Administered 2011-04-03: 20 mL

## 2011-04-03 MED ORDER — PIPERACILLIN-TAZOBACTAM 3.375 G IVPB
INTRAVENOUS | Status: AC
  Administered 2011-04-03: 3.375 g via INTRAVENOUS
  Filled 2011-04-03: qty 50

## 2011-04-03 MED ORDER — HYDROMORPHONE HCL PF 1 MG/ML IJ SOLN
0.2500 mg | INTRAMUSCULAR | Status: DC | PRN
  Administered 2011-04-03 – 2011-04-04 (×2): 0.5 mg via INTRAVENOUS

## 2011-04-03 MED ORDER — HYDROMORPHONE HCL PF 1 MG/ML IJ SOLN
0.5000 mg | Freq: Once | INTRAMUSCULAR | Status: AC
  Administered 2011-04-03: 0.5 mg via INTRAVENOUS

## 2011-04-03 MED ORDER — MIDAZOLAM HCL 5 MG/5ML IJ SOLN
INTRAMUSCULAR | Status: DC | PRN
  Administered 2011-04-03: 2 mg via INTRAVENOUS

## 2011-04-03 MED ORDER — ONDANSETRON HCL 4 MG/2ML IJ SOLN: 4.0000 mg | Freq: Once | INTRAMUSCULAR | Status: DC | PRN

## 2011-04-03 MED ORDER — SODIUM CHLORIDE 0.9 % IV SOLN
INTRAVENOUS | Status: DC | PRN
  Administered 2011-04-03 (×3): via INTRAVENOUS

## 2011-04-03 MED ORDER — SODIUM CHLORIDE 0.9 % IR SOLN
Status: DC | PRN
  Administered 2011-04-03: 1000 mL

## 2011-04-03 SURGICAL SUPPLY — 47 items
ADH SKN CLS APL DERMABOND .7 (GAUZE/BANDAGES/DRESSINGS) ×1
ADH SKN CLS LQ APL DERMABOND (GAUZE/BANDAGES/DRESSINGS) ×1
APPLIER CLIP ROT 10 11.4 M/L (STAPLE)
BAG SPEC RTRVL LRG 6X4 10 (ENDOMECHANICALS) ×1
BLADE SURG ROTATE 9660 (MISCELLANEOUS) ×2 IMPLANT
CANISTER SUCTION 2500CC (MISCELLANEOUS) ×2 IMPLANT
CHLORAPREP W/TINT 26ML (MISCELLANEOUS) ×2 IMPLANT
CLIP APPLIE ROT 10 11.4 M/L (STAPLE) IMPLANT
CLOTH BEACON ORANGE TIMEOUT ST (SAFETY) ×2 IMPLANT
COVER SURGICAL LIGHT HANDLE (MISCELLANEOUS) ×2 IMPLANT
CUTTER LINEAR ENDO 35 ETS (STAPLE) ×2 IMPLANT
CUTTER LINEAR ENDO 35 ETS TH (STAPLE) IMPLANT
DECANTER SPIKE VIAL GLASS SM (MISCELLANEOUS) ×2 IMPLANT
DERMABOND ADHESIVE PROPEN (GAUZE/BANDAGES/DRESSINGS) ×1
DERMABOND ADVANCED (GAUZE/BANDAGES/DRESSINGS) ×1
DERMABOND ADVANCED .7 DNX12 (GAUZE/BANDAGES/DRESSINGS) ×1 IMPLANT
DERMABOND ADVANCED .7 DNX6 (GAUZE/BANDAGES/DRESSINGS) ×1 IMPLANT
DRAPE UTILITY 15X26 W/TAPE STR (DRAPE) ×4 IMPLANT
DRSG TEGADERM 2-3/8X2-3/4 SM (GAUZE/BANDAGES/DRESSINGS) ×2 IMPLANT
ELECT REM PT RETURN 9FT ADLT (ELECTROSURGICAL) ×2
ELECTRODE REM PT RTRN 9FT ADLT (ELECTROSURGICAL) ×1 IMPLANT
ENDOLOOP SUT PDS II  0 18 (SUTURE)
ENDOLOOP SUT PDS II 0 18 (SUTURE) IMPLANT
GLOVE BIOGEL PI IND STRL 8 (GLOVE) ×1 IMPLANT
GLOVE BIOGEL PI INDICATOR 8 (GLOVE) ×1
GLOVE ECLIPSE 7.5 STRL STRAW (GLOVE) ×2 IMPLANT
GOWN STRL NON-REIN LRG LVL3 (GOWN DISPOSABLE) ×4 IMPLANT
KIT BASIN OR (CUSTOM PROCEDURE TRAY) ×2 IMPLANT
KIT ROOM TURNOVER OR (KITS) ×2 IMPLANT
NS IRRIG 1000ML POUR BTL (IV SOLUTION) ×2 IMPLANT
PAD ARMBOARD 7.5X6 YLW CONV (MISCELLANEOUS) ×4 IMPLANT
PENCIL BUTTON HOLSTER BLD 10FT (ELECTRODE) IMPLANT
POUCH SPECIMEN RETRIEVAL 10MM (ENDOMECHANICALS) ×2 IMPLANT
RELOAD /EVU35 (ENDOMECHANICALS) ×2 IMPLANT
RELOAD CUTTER ETS 35MM STAND (ENDOMECHANICALS) ×2 IMPLANT
SET IRRIG TUBING LAPAROSCOPIC (IRRIGATION / IRRIGATOR) ×2 IMPLANT
SPECIMEN JAR SMALL (MISCELLANEOUS) ×2 IMPLANT
STRIP CLOSURE SKIN 1/2X4 (GAUZE/BANDAGES/DRESSINGS) ×2 IMPLANT
SUT MNCRL AB 4-0 PS2 18 (SUTURE) ×2 IMPLANT
TOWEL OR 17X24 6PK STRL BLUE (TOWEL DISPOSABLE) ×2 IMPLANT
TOWEL OR 17X26 10 PK STRL BLUE (TOWEL DISPOSABLE) ×2 IMPLANT
TRAY FOLEY CATH 14FR (SET/KITS/TRAYS/PACK) ×2 IMPLANT
TRAY LAPAROSCOPIC (CUSTOM PROCEDURE TRAY) ×2 IMPLANT
TROCAR XCEL 12X100 BLDLESS (ENDOMECHANICALS) ×2 IMPLANT
TROCAR XCEL BLUNT TIP 100MML (ENDOMECHANICALS) ×2 IMPLANT
TROCAR XCEL NON-BLD 5MMX100MML (ENDOMECHANICALS) ×2 IMPLANT
WATER STERILE IRR 1000ML POUR (IV SOLUTION) IMPLANT

## 2011-04-03 NOTE — ED Notes (Signed)
Pt c/o lower abd pain with radiation to epigastric area; pt sts feels similar to when had tubal pregnancy years ago; pt sts LMP 3 weeks ago; pt denies vaginal discharge or UTI sx; pt tearful

## 2011-04-03 NOTE — Anesthesia Postprocedure Evaluation (Signed)
  Anesthesia Post-op Note  Patient: Robin [name]  Procedure(s) Performed:  APPENDECTOMY LAPAROSCOPIC  Patient Location: PACU  Anesthesia Type: General  Level of Consciousness: awake, oriented, sedated and patient cooperative  Airway and Oxygen Therapy: Patient Spontanous Breathing and Patient connected to nasal cannula oxygen  Post-op Pain: mild  Post-op Assessment: Post-op Vital signs reviewed, Patient's Cardiovascular Status Stable, Respiratory Function Stable, Patent Airway, No signs of Nausea or vomiting and Pain level controlled  Post-op Vital Signs: stable  Complications: No apparent anesthesia complications

## 2011-04-03 NOTE — Transfer of Care (Signed)
Immediate Anesthesia Transfer of Care Note  Patient: Robin [name]  Procedure(s) Performed:  APPENDECTOMY LAPAROSCOPIC  Patient Location: PACU  Anesthesia Type: General  Level of Consciousness: awake  Airway & Oxygen Therapy: Patient Spontanous Breathing and Patient connected to nasal cannula oxygen  Post-op Assessment: Report given to PACU RN  Post vital signs: stable  Complications: No apparent anesthesia complications

## 2011-04-03 NOTE — ED Provider Notes (Signed)
  Physical Exam  BP 113/68  Pulse 91  Temp(Src) 98.6 F (37 C) (Oral)  Resp 20  SpO2 99%  Physical Exam  ED Course  Procedures  MDM Pt continues to have pain, did give some ativan and toradol for inflammation and for spasms.  CT scan came back and read by radiologist as acute appendicitis.  No rupture.  I spoke to Dr. Lindie Spruce who will see pt in the ED.        Gavin Pound. Herman Fiero, MD 04/03/11 1821

## 2011-04-03 NOTE — Anesthesia Procedure Notes (Signed)
Procedure Name: Intubation Date/Time: 04/03/2011 8:43 PM Performed by: Alanda Amass Pre-anesthesia Checklist: Patient identified, Timeout performed, Emergency Drugs available, Suction available and Patient being monitored Patient Re-evaluated:Patient Re-evaluated prior to inductionOxygen Delivery Method: Circle System Utilized Preoxygenation: Pre-oxygenation with 100% oxygen Intubation Type: IV induction, Circoid Pressure applied and Rapid sequence Laryngoscope Size: Mac and 3 Grade View: Grade II Tube size: 8.0 mm Number of attempts: 1 Dental Injury: Teeth and Oropharynx as per pre-operative assessment

## 2011-04-03 NOTE — Anesthesia Preprocedure Evaluation (Addendum)
Anesthesia Evaluation  Patient identified by MRN, date of birth, ID band Patient awake    Reviewed: Allergy & Precautions, H&P , NPO status , Patient's Chart, lab work & pertinent test results  Airway Mallampati: II      Dental  (+) Teeth Intact and Dental Advisory Given   Pulmonary Current Smoker,    Pulmonary exam normal       Cardiovascular regular Normal    Neuro/Psych Negative Neurological ROS  Negative Psych ROS   GI/Hepatic Neg liver ROS, GERD-  ,  Endo/Other  Diabetes mellitus-, Poorly Controlled, Type 2, Insulin Dependent and Oral Hypoglycemic Agents  Renal/GU negative Renal ROS  Genitourinary negative   Musculoskeletal   Abdominal   Peds  Hematology negative hematology ROS (+)   Anesthesia Other Findings   Reproductive/Obstetrics                         Anesthesia Physical Anesthesia Plan  ASA: III  Anesthesia Plan: General   Post-op Pain Management:    Induction: Intravenous  Airway Management Planned: Oral ETT  Additional Equipment:   Intra-op Plan:   Post-operative Plan: Extubation in OR  Informed Consent: I have reviewed the patients History and Physical, chart, labs and discussed the procedure including the risks, benefits and alternatives for the proposed anesthesia with the patient or authorized representative who has indicated his/her understanding and acceptance.     Plan Discussed with: Anesthesiologist, CRNA and Surgeon  Anesthesia Plan Comments:         Anesthesia Quick Evaluation

## 2011-04-03 NOTE — ED Notes (Signed)
Pt complaining of lower abdominal pain 10/10.  Pt with episode of anxiety and hyperventilation.  Pt calm now.  During anxiety, pt. Complaining of chest tightness.  EKG ordered.  Pt. Placed on continuous cardiac and pulse oximetry monitoring.  Pt with SaO2 down to 89%. 2L-O2 placed on pt and SaO2 back up to 95%.  MD, Rubin Payor made aware.

## 2011-04-03 NOTE — Op Note (Signed)
OPERATIVE REPORT  DATE OF OPERATION: 04/03/2011  PATIENT:  Robin [name]  40 y.o. female  PRE-OPERATIVE DIAGNOSIS:  acute appendicitis  POST-OPERATIVE DIAGNOSIS:  Acute gangrenous appendicitis  PROCEDURE:  Procedure(s): APPENDECTOMY LAPAROSCOPIC  SURGEON:  Surgeon(s): Cherylynn Ridges III, MD  ASSISTANT: None  ANESTHESIA:   general  EBL: <30 ml  BLOOD ADMINISTERED: none  DRAINS: none   SPECIMEN:  Source of Specimen:  Gangrenous appendix  COUNTS CORRECT:  YES  PROCEDURE DETAILS: The patient was taken to the operating room and placed on the table in the supine position. After an adequate endotracheal anesthetic was administered she was prepped and draped in usual sterile manner exposing the entire abdomen.  After proper time out was performed identifying the patient and the procedure to be performed an infraumbilical midline incision was made using #15 blade and taken down to the midline fascia. We then incised the midline fascia using 15 blade approximately  1&1/2 cm long. We grabbed the edges of the fascia and tented it up as we bluntly dissected down into the peritoneal cavity with minimal difficulty.  Once this was done to purestring suture of 0 Vicryl was passed around the fascial opening. This secured and the [facility] cannula which was subsequently passed into the peritoneal cavity. We then insufflated carbon dioxide gas up to a maximal intra-abdominal pressure of 15 mm of mercury.  The patient was placed in Trendelenburg position the left side was tilted down. One of the anatomical anomalies that made this somewhat of a difficult appendectomy is that the patient had a very distended and markedly redundant cecum which flopped over towards the midline and made it difficult to access the right paracolic area.  The appendix was stuck behind the cecum in to the mesentery of small bowel however it was not retroperitoneal. We were able to dissect away the appendix from the base of  the cecum. Approximately 1 cm distal to the connection of the appendix to the cecum the appendix was gangrenous and hard likely encasing an appendicolith. Once that a window between the base of the cecum and the mesoappendix a 3.5 mm Endo GIA was heart across the base of the appendix. Once this was done we able to dissect out most the mesoappendix and then far a 2.5 mm white Endo GIA across the mesoappendix attaining adequate hemostasis.  An Endo Catch bag was passed through the left lower quadrant 12 mm cannula which had been passed under direct vision. Right upper quadrant 5 mm cannula been passed under direct vision also. Endo Catch bag removed the appendix about contaminating the subcutaneous tissue and the skin. Once this was done we irrigated with saline the room without cannabis aspirating all gas and closing of the fascia at the umbilical site using a procedure to which some place. Quarter percent Marcaine without epi was injected in all sites the skin at this infraumbilical in the left lower quadrant site was closed using running subcuticular stitch of 4-0 Monocryl. Dermabond Steri-Strips and Tegaderm were used to complete our dressings. All counts were correct.  PATIENT DISPOSITION:  PACU - hemodynamically stable.   Darryon Bastin III,Jaymes Hang O 12/6/201210:28 PM

## 2011-04-03 NOTE — ED Provider Notes (Addendum)
History     CSN: [id number] Arrival date & time: 04/03/2011 12:12 PM   First MD Initiated Contact with Patient 04/03/11 1238      Chief Complaint  Patient presents with  . Abdominal Pain    (Consider location/radiation/quality/duration/timing/severity/associated sxs/prior treatment) Patient is a 40 y.o. female presenting with abdominal pain. The history is provided by the patient.  Abdominal Pain The primary symptoms of the illness include abdominal pain. The primary symptoms of the illness do not include shortness of breath, nausea, vomiting or diarrhea. The current episode started yesterday. The problem has been gradually worsening.  Symptoms associated with the illness do not include back pain.   patient has severe lower abdominal pain, pain began yesterday. She states it feels like when she had a tubal pregnancy years ago. That time it was on the left side, it is on the right side now. Her last period was 2 weeks ago and was normal. No vomiting or diarrhea. No constipation. No vaginal discharge. The pain is constant. It is worse with movement. Nothing makes it better. No relief with medications at home. Patient states she thinks she is pregnant because hurts.  Past Medical History  Diagnosis Date  . Belching 02/18/2007  . Recurrent boils 07/08/2007  . Hypokalemia   . Anemia 11/06/2006    NOS  . Diabetes mellitus type 1 11/06/2006  . Vitamin D deficiency 02/20/2008  . Lumbar herniated disc 02/25/2008  . Elevated blood pressure 06/07/2008  . Dysmenorrhea 12/21/2008  . GERD (gastroesophageal reflux disease) 12/21/2008  . Tinea versicolor 12/21/2008    treated with ketoconazole  . Back pain 10/09    MRI with disc protrusion at L4-5 and L5-S1 with mild lateral recess encroachment on L5 nerve root; mod facet dz at L4-5 and L5-S1    Past Surgical History  Procedure Date  . Ectopic pregnancy surgery 1996    Family History  Problem Relation Age of Onset  . Hypertension Mother      History  Substance Use Topics  . Smoking status: Former Games developer  . Smokeless tobacco: Not on file  . Alcohol Use: 0.6 oz/week    1 Glasses of wine per week    OB History    Grav Para Term Preterm Abortions TAB SAB Ect Mult Living                  Review of Systems  Constitutional: Negative for activity change and appetite change.  HENT: Negative for neck stiffness.   Eyes: Negative for pain.  Respiratory: Negative for chest tightness and shortness of breath.   Cardiovascular: Negative for chest pain and leg swelling.  Gastrointestinal: Positive for abdominal pain. Negative for nausea, vomiting and diarrhea.  Genitourinary: Negative for flank pain.  Musculoskeletal: Negative for back pain.  Skin: Negative for rash.  Neurological: Negative for weakness, numbness and headaches.  Psychiatric/Behavioral: Negative for behavioral problems.    Allergies  Tramadol  Home Medications   Current Outpatient Rx  Name Route Sig Dispense Refill  . VITAMIN D 1000 UNITS PO CAPS Oral Take 1,000 Units by mouth daily.      Marland Kitchen FERROUS SULFATE 325 (65 FE) MG PO TABS Oral Take 325 mg by mouth 2 (two) times daily.      . INSULIN ISOPHANE & REGULAR (70-30) 100 UNIT/ML [place] SUSP Subcutaneous Inject 20-35 Units into the skin 2 (two) times daily with a meal. 35 units in the morning And 20 units in PM    . METFORMIN HCL  1000 MG PO TABS Oral Take 1,000 mg by mouth 2 (two) times daily with a meal.        BP 138/80  Pulse 91  Temp(Src) 99.2 F (37.3 C) (Oral)  Resp 20  SpO2 98%  Physical Exam  Nursing note and vitals reviewed. Constitutional: She is oriented to person, place, and time. She appears well-developed and well-nourished.  HENT:  Head: Normocephalic and atraumatic.  Eyes: EOM are normal. Pupils are equal, round, and reactive to light.  Neck: Normal range of motion. Neck supple.  Cardiovascular: Normal rate, regular rhythm and normal heart sounds.   No murmur  heard. Pulmonary/Chest: Effort normal and breath sounds normal. No respiratory distress. She has no wheezes. She has no rales.  Abdominal: Soft. Bowel sounds are normal. She exhibits no distension. There is tenderness. There is no rebound.       Moderate tenderness right lower abdomen. Some guarding.  Genitourinary:       No CVA tenderness. Tenderness on pelvic exam. Some white discharge.  Musculoskeletal: Normal range of motion.  Neurological: She is alert and oriented to person, place, and time. No cranial nerve deficit.  Skin: Skin is warm and dry.  Psychiatric: She has a normal mood and affect. Her speech is normal.    ED Course  Procedures (including critical care time)   Labs Reviewed  URINALYSIS, ROUTINE W REFLEX MICROSCOPIC  POCT PREGNANCY, URINE  CBC  DIFFERENTIAL  I-STAT, CHEM 8   No results found.   No diagnosis found.   Date: 04/03/2011  Rate: 92  Rhythm: normal sinus rhythm  QRS Axis: normal  Intervals: normal  ST/T Wave abnormalities: normal  Conduction Disutrbances:none  Narrative Interpretation:   Old EKG Reviewed: unchanged    MDM  Patient developed right lower abdominal pain. Severe. Lab work is reassuring, she does have a white count of 12.2, hemoglobin 11.1 which is about baseline for her. She also has many bacteria the urine, no nitrite leukoesterase or white cells. Care will be turned over to Dr. Oletta Lamas.       Juliet Rude. Rubin Payor, MD 04/03/11 1541  Juliet Rude. Rubin Payor, MD 04/03/11 1547

## 2011-04-03 NOTE — ED Notes (Signed)
Patient transported to CT 

## 2011-04-03 NOTE — H&P (Signed)
Robin [name] is an 40 y.o. female.   Chief Complaint: Abdominal pain HPI: Started yesterday associated with nausea, no vomiting, no diarrhea.  Chills but no fever.  Improved over night but worsened prior to work and patient came to the ED at about 12N.  CT just read as acute appendicitis without perforation or abscess.  Patient is a diabetic on insulin.  Past Medical History  Diagnosis Date  . Belching 02/18/2007  . Recurrent boils 07/08/2007  . Hypokalemia   . Anemia 11/06/2006    NOS  . Diabetes mellitus type 1 11/06/2006  . Vitamin D deficiency 02/20/2008  . Lumbar herniated disc 02/25/2008  . Elevated blood pressure 06/07/2008  . Dysmenorrhea 12/21/2008  . GERD (gastroesophageal reflux disease) 12/21/2008  . Tinea versicolor 12/21/2008    treated with ketoconazole  . Back pain 10/09    MRI with disc protrusion at L4-5 and L5-S1 with mild lateral recess encroachment on L5 nerve root; mod facet dz at L4-5 and L5-S1    Past Surgical History  Procedure Date  . Ectopic pregnancy surgery 1996    Family History  Problem Relation Age of Onset  . Hypertension Mother    Social History:  reports that she has quit smoking. She does not have any smokeless tobacco history on file. She reports that she drinks about .6 ounces of alcohol per week. She reports that she does not use illicit drugs.  Allergies:  Allergies  Allergen Reactions  . Tramadol Nausea And Vomiting    Medications Prior to Admission  Medication Dose Route Frequency Provider Last Rate Last Dose  . 0.9 %  sodium chloride infusion   Intravenous Continuous Juliet Rude. Rubin Payor, MD 125 mL/hr at 04/03/11 1419    . HYDROmorphone (DILAUDID) 2 MG/ML injection        1 mg at 04/03/11 1314  . HYDROmorphone (DILAUDID) 2 MG/ML injection        0.5 mg at 04/03/11 1803  . HYDROmorphone (DILAUDID) injection 0.5 mg  0.5 mg Intravenous Once Nathan R. Pickering, MD   0.5 mg at 04/03/11 1539  . HYDROmorphone (DILAUDID)  injection 1 mg  1 mg Intravenous Once Nathan R. Pickering, MD   1 mg at 04/03/11 1427  . iohexol (OMNIPAQUE) 300 MG/ML solution 100 mL  100 mL Intravenous Once PRN Medication Radiologist   100 mL at 04/03/11 1742  . ketorolac (TORADOL) 30 MG/ML injection        30 mg at 04/03/11 1759  . LORazepam (ATIVAN) 2 MG/ML injection        0.5 mg at 04/03/11 1758  . ondansetron (ZOFRAN) 4 MG/2ML injection        4 mg at 04/03/11 1804  . ondansetron (ZOFRAN) injection 4 mg  4 mg Intravenous Once [employer]. Pickering, MD   4 mg at 04/03/11 1422  . sodium chloride 0.9 % bolus 500 mL  500 mL Intravenous Once Harrold Donath R. Pickering, MD      . DISCONTD: HYDROmorphone (DILAUDID) 1 MG/ML injection            Medications Prior to Admission  Medication Sig Dispense Refill  . Cholecalciferol (VITAMIN D) 1000 UNITS capsule Take 1,000 Units by mouth daily.        . ferrous sulfate 325 (65 FE) MG tablet Take 325 mg by mouth 2 (two) times daily.        . insulin NPH-insulin regular (NOVOLIN 70/30) (70-30) 100 UNIT/ML injection Inject 20-35 Units into the skin 2 (two)  times daily with a meal. 35 units in the morning And 20 units in PM      . metFORMIN (GLUCOPHAGE) 1000 MG tablet Take 1,000 mg by mouth 2 (two) times daily with a meal.          Results for orders placed during the hospital encounter of 04/03/11 (from the past 48 hour(s))  URINALYSIS, ROUTINE W REFLEX MICROSCOPIC     Status: Abnormal   Collection Time   04/03/11 12:40 PM      Component Value Range Comment   Color, Urine YELLOW  YELLOW     APPearance CLOUDY (*) CLEAR     Specific Gravity, Urine 1.035 (*) 1.005 - 1.030     pH 5.5  5.0 - 8.0     Glucose, UA >1000 (*) NEGATIVE (mg/dL)    Hgb urine dipstick SMALL (*) NEGATIVE     Bilirubin Urine NEGATIVE  NEGATIVE     Ketones, ur 15 (*) NEGATIVE (mg/dL)    Protein, ur 161 (*) NEGATIVE (mg/dL)    Urobilinogen, UA 0.2  0.0 - 1.0 (mg/dL)    Nitrite NEGATIVE  NEGATIVE     Leukocytes, UA NEGATIVE  NEGATIVE      URINE MICROSCOPIC-ADD ON     Status: Abnormal   Collection Time   04/03/11 12:40 PM      Component Value Range Comment   Squamous Epithelial / LPF RARE  RARE     WBC, UA 0-2  <3 (WBC/hpf)    Bacteria, UA MANY (*) RARE     Casts HYALINE CASTS (*) NEGATIVE    CBC     Status: Abnormal   Collection Time   04/03/11  1:00 PM      Component Value Range Comment   WBC 12.2 (*) 4.0 - 10.5 (K/uL)    RBC 3.94  3.87 - 5.11 (MIL/uL)    Hemoglobin 11.1 (*) 12.0 - 15.0 (g/dL)    HCT 09.6 (*) 04.5 - 46.0 (%)    MCV 86.3  78.0 - 100.0 (fL)    MCH 28.2  26.0 - 34.0 (pg)    MCHC 32.6  30.0 - 36.0 (g/dL)    RDW 40.9  81.1 - 91.4 (%)    Platelets 294  150 - 400 (K/uL)   DIFFERENTIAL     Status: Abnormal   Collection Time   04/03/11  1:00 PM      Component Value Range Comment   Neutrophils Relative 80 (*) 43 - 77 (%)    Neutro Abs 9.7 (*) 1.7 - 7.7 (K/uL)    Lymphocytes Relative 13  12 - 46 (%)    Lymphs Abs 1.5  0.7 - 4.0 (K/uL)    Monocytes Relative 7  3 - 12 (%)    Monocytes Absolute 0.8  0.1 - 1.0 (K/uL)    Eosinophils Relative 1  0 - 5 (%)    Eosinophils Absolute 0.1  0.0 - 0.7 (K/uL)    Basophils Relative 0  0 - 1 (%)    Basophils Absolute 0.0  0.0 - 0.1 (K/uL)   PREGNANCY, URINE     Status: Normal   Collection Time   04/03/11  1:11 PM      Component Value Range Comment   Preg Test, Ur NEGATIVE     POCT I-STAT, CHEM 8     Status: Abnormal   Collection Time   04/03/11  2:22 PM      Component Value Range Comment   Sodium 139  135 -  145 (mEq/L)    Potassium 3.7  3.5 - 5.1 (mEq/L)    Chloride 102  96 - 112 (mEq/L)    BUN 8  6 - 23 (mg/dL)    Creatinine, Ser 9.56  0.50 - 1.10 (mg/dL)    Glucose, Bld 213 (*) 70 - 99 (mg/dL)    Calcium, Ion 0.86  1.12 - 1.32 (mmol/L)    TCO2 25  0 - 100 (mmol/L)    Hemoglobin 12.6  12.0 - 15.0 (g/dL)    HCT 57.8  46.9 - 62.9 (%)   WET PREP, GENITAL     Status: Abnormal   Collection Time   04/03/11  3:46 PM      Component Value Range Comment   Yeast, Wet  Prep NONE SEEN  NONE SEEN     Trich, Wet Prep FEW (*) NONE SEEN     Clue Cells, Wet Prep FEW (*) NONE SEEN     WBC, Wet Prep HPF POC FEW (*) NONE SEEN     Ct Abdomen Pelvis W Contrast  04/03/2011  *RADIOLOGY REPORT*  Clinical Data: Right lower quadrant pain.  Diabetes  CT ABDOMEN AND PELVIS WITH CONTRAST  Technique:  Multidetector CT imaging of the abdomen and pelvis was performed following the standard protocol during bolus administration of intravenous contrast.  Contrast: OMNIPAQUE IOHEXOL 300 MG/ML IV SOLN  Comparison: None.  Findings: Lung bases are clear.  1 cm hypodensity in the posterior right liver just under the diaphragm.  This is indeterminate.  No other liver lesions.  Gallbladder and bile ducts are normal. Pancreas is atrophic without focal edema or mass.  Spleen is normal.  Kidneys show no obstruction mass or calculi.  Thickened bowel loop in the right lower quadrant compatible with the appendix which extends anteriorly.  This contains edema and a mild amount of stranding in the adjacent fat.  These findings are compatible with acute appendicitis.  No abscess or free fluid is present.  2.6 cm right adnexal cyst.  The uterus is not enlarged.  IMPRESSION: Findings compatible with acute appendicitis without abscess or rupture.  1 cm indeterminate liver lesion.  Original Report Authenticated By: Camelia Phenes, M.D.    Review of Systems  Constitutional: Positive for chills (patient felt cold). Negative for fever.  HENT: Negative.   Eyes: Negative.   Respiratory: Positive for wheezing (more like stridor from stress).   Cardiovascular: Negative.   Gastrointestinal: Positive for nausea and abdominal pain (lower mid and RLQ pain). Negative for vomiting, diarrhea, constipation and blood in stool.  Genitourinary: Negative.   Musculoskeletal: Negative.   Skin: Negative.   Endo/Heme/Allergies: Negative.   Psychiatric/Behavioral: Negative.     Blood pressure 113/68, pulse 91,  temperature 98.6 F (37 C), temperature source Oral, resp. rate 20, SpO2 99.00%. Physical Exam  Constitutional: She is oriented to person, place, and time. She appears well-developed and well-nourished. She appears distressed (very anxious and crying about diagnosis).  HENT:  Head: Normocephalic and atraumatic.  Eyes: Conjunctivae and EOM are normal. Pupils are equal, round, and reactive to light.  Neck: Normal range of motion. Neck supple.  Cardiovascular: Normal rate, regular rhythm and normal heart sounds.   Respiratory: Effort normal and breath sounds normal.  GI: Soft. Bowel sounds are normal. She exhibits no mass. There is tenderness (RLQ tenderness, acute peritonitis in lower abdomen). There is rebound and guarding.  Musculoskeletal: Normal range of motion.  Neurological: She is alert and oriented to person, place, and time. She  has normal reflexes.  Skin: Skin is warm and dry.  Psychiatric: She has a normal mood and affect. Her behavior is normal. Judgment and thought content normal.     Assessment/Plan Acute appendicitis without apparent rupture on CT in diabetic patient.  IV antibiotics (Zosyn) now. Laparoscopic, possible open appendectomy tonight.  Jaedon Siler III,Drew Lips O 04/03/2011, 6:27 PM

## 2011-04-04 ENCOUNTER — Encounter (HOSPITAL_COMMUNITY): Payer: Self-pay | Admitting: *Deleted

## 2011-04-04 LAB — CBC
MCH: 28 pg (ref 26.0–34.0)
Platelets: 252 10*3/uL (ref 150–400)
RBC: 3.5 MIL/uL — ABNORMAL LOW (ref 3.87–5.11)
RDW: 15.5 % (ref 11.5–15.5)
WBC: 16.2 10*3/uL — ABNORMAL HIGH (ref 4.0–10.5)

## 2011-04-04 LAB — BASIC METABOLIC PANEL
Calcium: 7.5 mg/dL — ABNORMAL LOW (ref 8.4–10.5)
Creatinine, Ser: 0.63 mg/dL (ref 0.50–1.10)
GFR calc non Af Amer: >90 mL/min (ref >90)
Glucose, Bld: 246 mg/dL — ABNORMAL HIGH (ref 70–99)
Sodium: 133 mEq/L — ABNORMAL LOW (ref 135–145)

## 2011-04-04 LAB — GLUCOSE, CAPILLARY: Glucose-Capillary: 189 mg/dL — ABNORMAL HIGH (ref 70–99)

## 2011-04-04 LAB — HEMOGLOBIN A1C: Hgb A1c MFr Bld: 9.7 % — ABNORMAL HIGH (ref <5.7)

## 2011-04-04 MED ORDER — HYDROMORPHONE HCL PF 1 MG/ML IJ SOLN
1.0000 mg | INTRAMUSCULAR | Status: DC | PRN
  Administered 2011-04-04: 1 mg via INTRAVENOUS
  Filled 2011-04-04: qty 1

## 2011-04-04 MED ORDER — ENOXAPARIN SODIUM 40 MG/0.4ML ~~LOC~~ SOLN
40.0000 mg | SUBCUTANEOUS | Status: DC
  Administered 2011-04-04 – 2011-04-05 (×2): 40 mg via SUBCUTANEOUS
  Filled 2011-04-04 (×3): qty 0.4

## 2011-04-04 MED ORDER — PIPERACILLIN-TAZOBACTAM IN DEX 2-0.25 GM/50ML IV SOLN
2.2500 g | Freq: Three times a day (TID) | INTRAVENOUS | Status: DC
  Administered 2011-04-04: 2.25 g via INTRAVENOUS
  Filled 2011-04-04 (×2): qty 50

## 2011-04-04 MED ORDER — ONDANSETRON HCL 4 MG/2ML IJ SOLN
4.0000 mg | Freq: Four times a day (QID) | INTRAMUSCULAR | Status: DC | PRN
  Administered 2011-04-05: 4 mg via INTRAVENOUS
  Filled 2011-04-04 (×2): qty 2

## 2011-04-04 MED ORDER — PIPERACILLIN-TAZOBACTAM 3.375 G IVPB
3.3750 g | Freq: Three times a day (TID) | INTRAVENOUS | Status: DC
  Administered 2011-04-04 – 2011-04-06 (×6): 3.375 g via INTRAVENOUS
  Filled 2011-04-04 (×9): qty 50

## 2011-04-04 MED ORDER — INSULIN ASPART 100 UNIT/ML ~~LOC~~ SOLN
0.0000 [IU] | Freq: Three times a day (TID) | SUBCUTANEOUS | Status: DC
  Administered 2011-04-04: 11 [IU] via SUBCUTANEOUS
  Administered 2011-04-04 (×2): 7 [IU] via SUBCUTANEOUS
  Administered 2011-04-05: 11 [IU] via SUBCUTANEOUS
  Administered 2011-04-05 (×2): 4 [IU] via SUBCUTANEOUS
  Administered 2011-04-06: 7 [IU] via SUBCUTANEOUS
  Filled 2011-04-04: qty 3

## 2011-04-04 MED ORDER — DIPHENHYDRAMINE HCL 25 MG PO CAPS
25.0000 mg | ORAL_CAPSULE | ORAL | Status: DC | PRN
  Administered 2011-04-04 (×2): 25 mg via ORAL
  Filled 2011-04-04 (×2): qty 1

## 2011-04-04 MED ORDER — ONDANSETRON HCL 4 MG PO TABS: 4.0000 mg | ORAL_TABLET | Freq: Four times a day (QID) | ORAL | Status: DC | PRN

## 2011-04-04 MED ORDER — OXYCODONE-ACETAMINOPHEN 5-325 MG PO TABS
1.0000 | ORAL_TABLET | ORAL | Status: DC | PRN
  Administered 2011-04-04 (×2): 2 via ORAL
  Filled 2011-04-04: qty 1
  Filled 2011-04-04 (×2): qty 2

## 2011-04-04 MED ORDER — INSULIN GLARGINE 100 UNIT/ML ~~LOC~~ SOLN
15.0000 [IU] | Freq: Every day | SUBCUTANEOUS | Status: DC
  Administered 2011-04-04 – 2011-04-05 (×2): 15 [IU] via SUBCUTANEOUS
  Filled 2011-04-04: qty 3

## 2011-04-04 MED ORDER — INSULIN ASPART 100 UNIT/ML ~~LOC~~ SOLN
6.0000 [IU] | Freq: Three times a day (TID) | SUBCUTANEOUS | Status: DC
  Administered 2011-04-04 (×3): 6 [IU] via SUBCUTANEOUS

## 2011-04-04 MED ORDER — POTASSIUM CHLORIDE IN NACL 20-0.9 MEQ/L-% IV SOLN
INTRAVENOUS | Status: DC
  Administered 2011-04-04 – 2011-04-05 (×6): via INTRAVENOUS
  Filled 2011-04-04 (×6): qty 1000

## 2011-04-04 MED ORDER — METRONIDAZOLE IN NACL 5-0.79 MG/ML-% IV SOLN
500.0000 mg | Freq: Three times a day (TID) | INTRAVENOUS | Status: DC
  Administered 2011-04-04 (×2): 500 mg via INTRAVENOUS
  Filled 2011-04-04 (×3): qty 100

## 2011-04-04 MED ORDER — PIPERACILLIN-TAZOBACTAM 3.375 G IVPB
3.3750 g | Freq: Once | INTRAVENOUS | Status: AC
  Administered 2011-04-04: 3.375 g via INTRAVENOUS
  Filled 2011-04-04: qty 50

## 2011-04-04 NOTE — Progress Notes (Signed)
   CARE MANAGEMENT NOTE 04/04/2011  Patient:  Robin [name], Robin [name]   Account Number:  [id number]  Date Initiated:  04/04/2011  Documentation initiated by:  Carlyle Lipa  Subjective/Objective Assessment:   appendicitis; lap appendectomy     Action/Plan:   Anticipate home 12/8 with no HH needs   Anticipated DC Date:  04/05/2011   Anticipated DC Plan:  HOME/SELF CARE      DC Planning Services  CM consult               Status of service:  In process, will continue to follow  Per UR Regulation:  Reviewed for med. necessity/level of care/duration of stay

## 2011-04-04 NOTE — Progress Notes (Signed)
Patient examined and I agree with the assessment and plan  Robin [name] E  

## 2011-04-04 NOTE — Progress Notes (Signed)
ANTIBIOTIC CONSULT NOTE - INITIAL  Pharmacy Consult for Zosyn Indication: s/p appendectomy  Allergies  Allergen Reactions  . Tramadol Nausea And Vomiting    Patient Measurements: Height: 5\' 4"  (162.6 cm) Weight: 242 lb 8.1 oz (110 kg) IBW/kg (Calculated) : 54.7    Vital Signs: Temp: 98 F (36.7 C) (12/07 0006) Temp src: Oral (12/07 0006) BP: 117/69 mmHg (12/07 0006) Pulse Rate: 92  (12/07 0006)  Labs:  Basename 04/03/11 1422 04/03/11 1300  WBC -- 12.2*  HGB 12.6 11.1*  PLT -- 294  LABCREA -- --  CREATININE 0.60 --   Estimated Creatinine Clearance: 115.6 ml/min (by C-G formula based on Cr of 0.6).  Microbiology: Recent Results (from the past 720 hour(s))  WET PREP, GENITAL     Status: Abnormal   Collection Time   04/03/11  3:46 PM      Component Value Range Status Comment   Yeast, Wet Prep NONE SEEN  NONE SEEN  Final    Trich, Wet Prep FEW (*) NONE SEEN  Final    Clue Cells, Wet Prep FEW (*) NONE SEEN  Final    WBC, Wet Prep HPF POC FEW (*) NONE SEEN  Final    Medical History: Past Medical History  Diagnosis Date  . Belching 02/18/2007  . Recurrent boils 07/08/2007  . Hypokalemia   . Anemia 11/06/2006    NOS  . Diabetes mellitus type 1 11/06/2006  . Vitamin D deficiency 02/20/2008  . Lumbar herniated disc 02/25/2008  . Elevated blood pressure 06/07/2008  . Dysmenorrhea 12/21/2008  . GERD (gastroesophageal reflux disease) 12/21/2008  . Tinea versicolor 12/21/2008    treated with ketoconazole  . Back pain 10/09    MRI with disc protrusion at L4-5 and L5-S1 with mild lateral recess encroachment on L5 nerve root; mod facet dz at L4-5 and L5-S1   Medications:  Prescriptions prior to admission  Medication Sig Dispense Refill  . Cholecalciferol (VITAMIN D) 1000 UNITS capsule Take 1,000 Units by mouth daily.        . ferrous sulfate 325 (65 FE) MG tablet Take 325 mg by mouth 2 (two) times daily.        . insulin NPH-insulin regular (NOVOLIN 70/30) (70-30)  100 UNIT/ML injection Inject 20-35 Units into the skin 2 (two) times daily with a meal. 35 units in the morning And 20 units in PM      . metFORMIN (GLUCOPHAGE) 1000 MG tablet Take 1,000 mg by mouth 2 (two) times daily with a meal.         Assessment: 39yo female to begin Zosyn s/p appendectomy.  Plan:  Rec'd Zosyn in ED; will begin Zosyn 3.375g IV Q8H and monitor CBC and Cx.  Colleen Can PharmD BCPS 04/04/2011,1:23 AM

## 2011-04-04 NOTE — Progress Notes (Signed)
1 Day Post-Op  Subjective: S/p lap appendectomy.  Doing well this am.  Tolerating regular diet.  Moderate pain, no nausea.   No BM but +flatus.   Objective: Vital signs in last 24 hours: Temp:  [97.4 F (36.3 C)-99.2 F (37.3 C)] 98.5 F (36.9 C) (12/07 0655) Pulse Rate:  [81-99] 90  (12/07 0655) Resp:  [13-24] 18  (12/07 0655) BP: (102-138)/(62-80) 102/62 mmHg (12/07 0655) SpO2:  [93 %-100 %] 100 % (12/07 0655) Weight:  [230 lb (104.327 kg)-242 lb 8.1 oz (110 kg)] 242 lb 8.1 oz (110 kg) (12/07 0006) Last BM Date: 04/03/11  Intake/Output this shift:    Physical Exam: BP 102/62  Pulse 90  Temp(Src) 98.5 F (36.9 C) (Oral)  Resp 18  Ht 5\' 4"  (1.626 m)  Wt 242 lb 8.1 oz (110 kg)  BMI 41.63 kg/m2  SpO2 100% Gen:  Sitting up in bed talking on telephone and eating breakfast Abd:  Soft, mild tenderness in LRQ and around incisions.  Incisions clean and dry.   Lungs: CTAB  Labs: CBC  Basename 04/04/11 0256 04/03/11 1422 04/03/11 1300  WBC 16.2* -- 12.2*  HGB 9.8* 12.6 --  HCT 30.6* 37.0 --  PLT 252 -- 294   BMET  Basename 04/04/11 0256 04/03/11 1422  NA 133* 139  K 3.9 3.7  CL 102 102  CO2 24 --  GLUCOSE 246* 203*  BUN 7 8  CREATININE 0.63 0.60  CALCIUM 7.5* --    Studies/Results: Ct Abdomen Pelvis W Contrast  04/03/2011  *RADIOLOGY REPORT*  Clinical Data: Right lower quadrant pain.  Diabetes  CT ABDOMEN AND PELVIS WITH CONTRAST  Technique:  Multidetector CT imaging of the abdomen and pelvis was performed following the standard protocol during bolus administration of intravenous contrast.  Contrast: OMNIPAQUE IOHEXOL 300 MG/ML IV SOLN  Comparison: None.  Findings: Lung bases are clear.  1 cm hypodensity in the posterior right liver just under the diaphragm.  This is indeterminate.  No other liver lesions.  Gallbladder and bile ducts are normal. Pancreas is atrophic without focal edema or mass.  Spleen is normal.  Kidneys show no obstruction mass or calculi.   Thickened bowel loop in the right lower quadrant compatible with the appendix which extends anteriorly.  This contains edema and a mild amount of stranding in the adjacent fat.  These findings are compatible with acute appendicitis.  No abscess or free fluid is present.  2.6 cm right adnexal cyst.  The uterus is not enlarged.  IMPRESSION: Findings compatible with acute appendicitis without abscess or rupture.  1 cm indeterminate liver lesion.  Original Report Authenticated By: Camelia Phenes, M.D.    Assessment: Active Problems:  * No active hospital problems. *    Procedure(s): APPENDECTOMY LAPAROSCOPIC  Plan: WBC elevated today, ?demargination, will continue abx however since gangrenous appendix Recheck CBC in am Carb-Mod diet If continues to tolerate diet and WBC coming down, anticipated d/c in am.   LOS: 1 day    Mayia Megill 04/04/2011

## 2011-04-04 NOTE — Progress Notes (Signed)
Noted pt s/p appendectomy.  AM CBG today: 248 mg/dl  Home DM medications:  Novolin 70/30 insulin 35 units AM, 20 units supper        Metformin 1000 mg bid  Noted Lantus 15 units QHS started today along with Novolog Resistant SSI plus 6 units Novolog meal coverage.  Possible D/C in AM.  Will follow.

## 2011-04-05 ENCOUNTER — Inpatient Hospital Stay (HOSPITAL_COMMUNITY): Payer: Self-pay

## 2011-04-05 LAB — GLUCOSE, CAPILLARY
Glucose-Capillary: 259 mg/dL — ABNORMAL HIGH (ref 70–99)
Glucose-Capillary: 179 mg/dL — ABNORMAL HIGH (ref 70–99)
Glucose-Capillary: 166 mg/dL — ABNORMAL HIGH (ref 70–99)

## 2011-04-05 LAB — CBC
HCT: 31.2 % — ABNORMAL LOW (ref 36.0–46.0)
Hemoglobin: 10 g/dL — ABNORMAL LOW (ref 12.0–15.0)
MCH: 28 pg (ref 26.0–34.0)
MCV: 87.4 fL (ref 78.0–100.0)
Platelets: 265 10*3/uL (ref 150–400)
RBC: 3.57 MIL/uL — ABNORMAL LOW (ref 3.87–5.11)

## 2011-04-05 MED ORDER — PROMETHAZINE HCL 25 MG/ML IJ SOLN
25.0000 mg | Freq: Four times a day (QID) | INTRAMUSCULAR | Status: DC | PRN
  Administered 2011-04-05: 25 mg via INTRAVENOUS
  Filled 2011-04-05: qty 1

## 2011-04-05 NOTE — Progress Notes (Signed)
Pt nauseated after Phenergan administered, has vomited twice (yellow emesis).  Abdomen distended, faint bowel sounds.  Notified Dr. Lorette Ang diet to NPO and ordered 1 view abdominal x-ray this am.

## 2011-04-05 NOTE — Progress Notes (Signed)
Pt complaining of nausea/bloating.  4mg   Zofran IV given at 0140 with no improvement.  Notified Dr. Luisa Hart, who ordered 25mg  Phenergan IV q6hrs prn.  Will continue to monitor.

## 2011-04-05 NOTE — Progress Notes (Signed)
2 Days Post-Op  Subjective: Nausea and some emesis, has passed some flatus  Objective: Vital signs in last 24 hours: Temp:  [98 F (36.7 C)-99.3 F (37.4 C)] 98.3 F (36.8 C) (12/08 0610) Pulse Rate:  [78-94] 94  (12/08 0610) Resp:  [18-19] 19  (12/08 0610) BP: (107-149)/(57-73) 149/57 mmHg (12/08 0610) SpO2:  [92 %-95 %] 92 % (12/08 0610) Last BM Date: 04/03/11  Intake/Output from previous day: 12/07 0701 - 12/08 0700 In: 2795 [P.O.:840; I.V.:1905; IV Piggyback:50] Out: 1750 [Urine:1750] Intake/Output this shift: Total I/O In: 54.6 [IV Piggyback:54.6] Out: -   General appearance: no distress Resp: clear to auscultation bilaterally Cardio: regular rate and rhythm, S1, S2 normal, no murmur, click, rub or gallop GI: dressings dry, some bs, mild distended, approp tender  Lab Results:   [facility] 04/05/11 0938 04/04/11 0256  WBC 14.1* 16.2*  HGB 10.0* 9.8*  HCT 31.2* 30.6*  PLT 265 252   BMET  Basename 04/04/11 0256 04/03/11 1422  NA 133* 139  K 3.9 3.7  CL 102 102  CO2 24 --  GLUCOSE 246* 203*  BUN 7 8  CREATININE 0.63 0.60  CALCIUM 7.5* --   PT/INR No results found for this basename: LABPROT:2,INR:2 in the last 72 hours ABG No results found for this basename: PHART:2,PCO2:2,PO2:2,HCO3:2 in the last 72 hours  Studies/Results: Dg Abd 1 View  04/05/2011  *RADIOLOGY REPORT*  Clinical Data: Nausea and vomiting.  Abdominal distention.  ABDOMEN - 1 VIEW  Comparison: 04/03/2011 abdominal CT.  Findings: Distal progression of contrast.  Surgical staples are present in the right lower quadrant compatible with appendectomy. Nonobstructive bowel gas pattern.  No plain film evidence of free air on this supine view.  IMPRESSION: Normal bowel gas pattern status post appendectomy.  No evidence of ileus or obstruction.  Original Report Authenticated By: Andreas Newport, M.D.   Ct Abdomen Pelvis W Contrast  04/03/2011  *RADIOLOGY REPORT*  Clinical Data: Right lower quadrant pain.   Diabetes  CT ABDOMEN AND PELVIS WITH CONTRAST  Technique:  Multidetector CT imaging of the abdomen and pelvis was performed following the standard protocol during bolus administration of intravenous contrast.  Contrast: OMNIPAQUE IOHEXOL 300 MG/ML IV SOLN  Comparison: None.  Findings: Lung bases are clear.  1 cm hypodensity in the posterior right liver just under the diaphragm.  This is indeterminate.  No other liver lesions.  Gallbladder and bile ducts are normal. Pancreas is atrophic without focal edema or mass.  Spleen is normal.  Kidneys show no obstruction mass or calculi.  Thickened bowel loop in the right lower quadrant compatible with the appendix which extends anteriorly.  This contains edema and a mild amount of stranding in the adjacent fat.  These findings are compatible with acute appendicitis.  No abscess or free fluid is present.  2.6 cm right adnexal cyst.  The uterus is not enlarged.  IMPRESSION: Findings compatible with acute appendicitis without abscess or rupture.  1 cm indeterminate liver lesion.  Original Report Authenticated By: Camelia Phenes, M.D.    Anti-infectives: Anti-infectives     Start     Dose/Rate Route Frequency Ordered Stop   04/04/11 1400  piperacillin-tazobactam (ZOSYN) IVPB 3.375 g       3.375 g 12.5 mL/hr over 240 Minutes Intravenous 3 times per day 04/04/11 0955     04/04/11 0800   piperacillin-tazobactam (ZOSYN) IVPB 2.25 g  Status:  Discontinued        2.25 g 100 mL/hr over 30  Minutes Intravenous Every 8 hours 04/04/11 0122 04/04/11 0954   04/04/11 0130  piperacillin-tazobactam (ZOSYN) IVPB 3.375 g       3.375 g 12.5 mL/hr over 240 Minutes Intravenous  Once 04/04/11 0122 04/04/11 0616   04/04/11 0051   metroNIDAZOLE (FLAGYL) IVPB 500 mg  Status:  Discontinued        500 mg 100 mL/hr over 60 Minutes Intravenous Every 8 hours 04/04/11 0051 04/04/11 1300   04/03/11 1835  piperacillin-tazobactam (ZOSYN) 3-0.375 GM/50ML IVPB    Comments: ROBBINS,  KATIE: cabinet override        04/03/11 1835 04/03/11 1904          Assessment/Plan: s/p Procedure(s): APPENDECTOMY LAPAROSCOPIC  Neuro-cont iv pain meds Pulm toilet GI: ileus with nausea, check kub, cont npo Renal check bmet tomorrow, follow uop ID wbc still elevated, cont zosyn, recheck tomorrow CBGs fair control, will follow   LOS: 2 days    [facility] 04/05/2011

## 2011-04-06 LAB — BASIC METABOLIC PANEL
Calcium: 8.6 mg/dL (ref 8.4–10.5)
GFR calc Af Amer: >90 mL/min (ref >90)
GFR calc non Af Amer: >90 mL/min (ref >90)
Potassium: 3.7 mEq/L (ref 3.5–5.1)
Sodium: 135 mEq/L (ref 135–145)

## 2011-04-06 LAB — CBC
MCH: 27.4 pg (ref 26.0–34.0)
MCHC: 31.8 g/dL (ref 30.0–36.0)
Platelets: 313 10*3/uL (ref 150–400)
RDW: 15.4 % (ref 11.5–15.5)

## 2011-04-06 LAB — GLUCOSE, CAPILLARY
Glucose-Capillary: 208 mg/dL — ABNORMAL HIGH (ref 70–99)
Glucose-Capillary: 212 mg/dL — ABNORMAL HIGH (ref 70–99)

## 2011-04-06 MED ORDER — INFLUENZA VIRUS VACC SPLIT PF IM SUSP
0.5000 mL | INTRAMUSCULAR | Status: DC
  Administered 2011-04-06: 0.5 mL via INTRAMUSCULAR
  Filled 2011-04-06: qty 0.5

## 2011-04-06 MED ORDER — PNEUMOCOCCAL VAC POLYVALENT 25 MCG/0.5ML IJ INJ
0.5000 mL | INJECTION | INTRAMUSCULAR | Status: DC
  Administered 2011-04-06: 0.5 mL via INTRAMUSCULAR
  Filled 2011-04-06: qty 0.5

## 2011-04-06 MED ORDER — HYDROCODONE-ACETAMINOPHEN 5-325 MG PO TABS: 1.0000 | ORAL_TABLET | ORAL | Status: AC | PRN

## 2011-04-06 NOTE — Progress Notes (Signed)
3 Days Post-Op  Subjective: Feels better today. Nausea has resolved.  Objective: Vital signs in last 24 hours: Temp:  [98.5 F (36.9 C)-99.2 F (37.3 C)] 98.5 F (36.9 C) (12/09 0500) Pulse Rate:  [91-100] 91  (12/09 0500) Resp:  [18] 18  (12/09 0500) BP: (126-159)/(71-88) 136/71 mmHg (12/09 0500) SpO2:  [95 %-98 %] 95 % (12/09 0500) Last BM Date: 04/03/11  Intake/Output from previous day: 12/08 0701 - 12/09 0700 In: 54.6 [IV Piggyback:54.6] Out: -  Intake/Output this shift:    GI: soft, non-tender; bowel sounds normal; no masses,  no organomegaly  Lab Results:   [facility] 04/06/11 0554 04/05/11 0938  WBC 13.9* 14.1*  HGB 9.4* 10.0*  HCT 29.6* 31.2*  PLT 313 265   BMET  Basename 04/06/11 0554 04/04/11 0256  NA 135 133*  K 3.7 3.9  CL 102 102  CO2 17* 24  GLUCOSE 216* 246*  BUN 7 7  CREATININE 0.58 0.63  CALCIUM 8.6 7.5*   PT/INR No results found for this basename: LABPROT:2,INR:2 in the last 72 hours ABG No results found for this basename: PHART:2,PCO2:2,PO2:2,HCO3:2 in the last 72 hours  Studies/Results: Dg Abd 1 View  04/05/2011  *RADIOLOGY REPORT*  Clinical Data: Nausea and vomiting.  Abdominal distention.  ABDOMEN - 1 VIEW  Comparison: 04/03/2011 abdominal CT.  Findings: Distal progression of contrast.  Surgical staples are present in the right lower quadrant compatible with appendectomy. Nonobstructive bowel gas pattern.  No plain film evidence of free air on this supine view.  IMPRESSION: Normal bowel gas pattern status post appendectomy.  No evidence of ileus or obstruction.  Original Report Authenticated By: Andreas Newport, M.D.   Dg Abd Portable 1v  04/05/2011  *RADIOLOGY REPORT*  Clinical Data: Abdominal pain.  Nausea and vomiting.  Postop from appendectomy.  ABDOMEN - 1 VIEW  Comparison: Prior today  Findings: Residual contrast is again seen throughout the colon.  No evidence of dilated bowel loops or other radiographic abnormality.  IMPRESSION: Normal  bowel gas pattern.  Residual contrast again seen throughout colon.  Original Report Authenticated By: Danae Orleans, M.D.    Anti-infectives: Anti-infectives     Start     Dose/Rate Route Frequency Ordered Stop   04/04/11 1400   piperacillin-tazobactam (ZOSYN) IVPB 3.375 g        3.375 g 12.5 mL/hr over 240 Minutes Intravenous 3 times per day 04/04/11 0955     04/04/11 0800   piperacillin-tazobactam (ZOSYN) IVPB 2.25 g  Status:  Discontinued        2.25 g 100 mL/hr over 30 Minutes Intravenous Every 8 hours 04/04/11 0122 04/04/11 0954   04/04/11 0130   piperacillin-tazobactam (ZOSYN) IVPB 3.375 g        3.375 g 12.5 mL/hr over 240 Minutes Intravenous  Once 04/04/11 0122 04/04/11 0616   04/04/11 0051   metroNIDAZOLE (FLAGYL) IVPB 500 mg  Status:  Discontinued        500 mg 100 mL/hr over 60 Minutes Intravenous Every 8 hours 04/04/11 0051 04/04/11 1300   04/03/11 1835   piperacillin-tazobactam (ZOSYN) 3-0.375 GM/50ML IVPB     Comments: ROBBINS, KATIE: cabinet override         04/03/11 1835 04/03/11 1904          Assessment/Plan: s/p Procedure(s): APPENDECTOMY LAPAROSCOPIC Advance diet Discharge Continue abx  LOS: 3 days    TOTH III,Jeilyn Reznik S 04/06/2011

## 2011-04-06 NOTE — Discharge Summary (Signed)
Physician Discharge Summary  Patient ID: Robin [name] MRN: [id number] DOB/AGE: 06-05-1970 40 y.o.  Admit date: 04/03/2011 Discharge date: 04/06/2011  Admission Diagnoses:  Discharge Diagnoses:  Active Problems:  * No active hospital problems. *    Discharged Condition: good  Hospital Course: the patient was admitted with appendicitis. She underwent laparoscopic appendectomy. On postoperative day one her course was complicated by some nausea and vomiting.this is now resolved. She is ready for discharge home.  Consults: none  Significant Diagnostic Studies: none  Treatments: surgery: laparoscopic appendectomy  Discharge Exam: Blood pressure 136/71, pulse 91, temperature 98.5 F (36.9 C), temperature source Oral, resp. rate 18, height 5\' 4"  (1.626 m), weight 242 lb 8.1 oz (110 kg), SpO2 95.00%. GI: soft, non-tender; bowel sounds normal; no masses,  no organomegaly  Disposition:   Discharge Orders    Future Appointments: Provider: Department: Dept Phone: Center:   04/09/2011 11:15 AM Darnelle Maffucci [facility] [phone] [facility]     Future Orders Please Complete By Expires   Diet - low sodium heart healthy      Increase activity slowly      Discharge instructions      Comments:   No heavy lifting. Patient may shower. Diet as tolerated.   Lifting restrictions      Comments:   Do not lift more than 5 lbs   No wound care      Call MD for:  temperature >100.4      Call MD for:  persistant nausea and vomiting      Call MD for:  severe uncontrolled pain      Call MD for:  redness, tenderness, or signs of infection (pain, swelling, redness, odor or green/yellow discharge around incision site)      Call MD for:  difficulty breathing, headache or visual disturbances      Call MD for:  hives      Call MD for:  persistant dizziness or light-headedness      Call MD for:  extreme fatigue        Current Discharge Medication List    START taking these medications   Details  HYDROcodone-acetaminophen (NORCO) 5-325 MG per tablet Take 1-2 tablets by mouth every 4 (four) hours as needed for pain. Qty: 50 tablet, Refills: 1      CONTINUE these medications which have NOT CHANGED   Details  Cholecalciferol (VITAMIN D) 1000 UNITS capsule Take 1,000 Units by mouth daily.      ferrous sulfate 325 (65 FE) MG tablet Take 325 mg by mouth 2 (two) times daily.      insulin NPH-insulin regular (NOVOLIN 70/30) (70-30) 100 UNIT/ML injection Inject 20-35 Units into the skin 2 (two) times daily with a meal. 35 units in the morning And 20 units in PM    metFORMIN (GLUCOPHAGE) 1000 MG tablet Take 1,000 mg by mouth 2 (two) times daily with a meal.         Follow-up Information    Follow up with [facility],MD on 04/18/2011. ([facility] clinic 3:20pm)    Contact information:   [address] [place] [address] [phone]          Signed: Robyne Askew 04/06/2011, 9:11 AM

## 2011-04-08 ENCOUNTER — Encounter (HOSPITAL_COMMUNITY): Payer: Self-pay | Admitting: General Surgery

## 2011-04-09 ENCOUNTER — Ambulatory Visit (INDEPENDENT_AMBULATORY_CARE_PROVIDER_SITE_OTHER): Payer: Self-pay | Admitting: Internal Medicine

## 2011-04-09 ENCOUNTER — Encounter: Payer: Self-pay | Admitting: Internal Medicine

## 2011-04-09 DIAGNOSIS — E119 Type 2 diabetes mellitus without complications: Secondary | ICD-10-CM

## 2011-04-09 DIAGNOSIS — E785 Hyperlipidemia, unspecified: Secondary | ICD-10-CM

## 2011-04-09 DIAGNOSIS — E109 Type 1 diabetes mellitus without complications: Secondary | ICD-10-CM

## 2011-04-09 DIAGNOSIS — K3580 Unspecified acute appendicitis: Secondary | ICD-10-CM | POA: Insufficient documentation

## 2011-04-09 LAB — LIPID PANEL
Cholesterol: 186 mg/dL (ref 0–200)
Triglycerides: 88 mg/dL (ref <150)
VLDL: 18 mg/dL (ref 0–40)

## 2011-04-09 LAB — COMPREHENSIVE METABOLIC PANEL
ALT: 17 U/L (ref 0–35)
CO2: 25 mEq/L (ref 19–32)
Calcium: 9.4 mg/dL (ref 8.4–10.5)
Chloride: 100 mEq/L (ref 96–112)
Sodium: 136 mEq/L (ref 135–145)
Total Protein: 6.9 g/dL (ref 6.0–8.3)

## 2011-04-09 LAB — GLUCOSE, CAPILLARY: Glucose-Capillary: 326 mg/dL — ABNORMAL HIGH (ref 70–99)

## 2011-04-09 MED ORDER — INSULIN NPH ISOPHANE & REGULAR (70-30) 100 UNIT/ML ~~LOC~~ SUSP: SUBCUTANEOUS | Status: DC

## 2011-04-09 NOTE — Progress Notes (Signed)
  Subjective:    Patient ID: Robin [name], female    DOB: 11/09/1970, 40 y.o.   MRN: [id number]  HPI  Patient is a 40 year old female with a past medical history listed below, presents to the outpatient clinic for hospital followup after she was admitted for acute appendicitis is now status post laparoscopic appendectomy, patient reports that her wound site is healing well, denies any complaints denies any fever chills, reports compliance with her medications.  Patient Active Problem List  Diagnoses  . TINEA VERSICOLOR  . DIABETES MELLITUS, TYPE I  . VITAMIN D DEFICIENCY  . ANEMIA-NOS  . GERD   Current Outpatient Prescriptions on File Prior to Visit  Medication Sig Dispense Refill  . Cholecalciferol (VITAMIN D) 1000 UNITS capsule Take 1,000 Units by mouth daily.        . ferrous sulfate 325 (65 FE) MG tablet Take 325 mg by mouth 2 (two) times daily.        Marland Kitchen HYDROcodone-acetaminophen (NORCO) 5-325 MG per tablet Take 1-2 tablets by mouth every 4 (four) hours as needed for pain.  50 tablet  1  . metFORMIN (GLUCOPHAGE) 1000 MG tablet Take 1,000 mg by mouth 2 (two) times daily with a meal.         Allergies  Allergen Reactions  . Tramadol Nausea And Vomiting     Review of Systems  All other systems reviewed and are negative.       Objective:   Physical Exam  Nursing note and vitals reviewed. Constitutional: She is oriented to person, place, and time. She appears well-developed and well-nourished.  HENT:  Head: Normocephalic and atraumatic.  Eyes: Pupils are equal, round, and reactive to light.  Neck: Normal range of motion. Neck supple. No JVD present. No thyromegaly present.  Cardiovascular: Normal rate, regular rhythm and normal heart sounds.   No murmur heard. Pulmonary/Chest: Effort normal and breath sounds normal. She has no wheezes. She has no rales.  Abdominal: Soft. Bowel sounds are normal.    Musculoskeletal: Normal range of motion. She exhibits no edema.    Neurological: She is alert and oriented to person, place, and time.  Skin: Skin is warm and dry.   laparoscopy sites appears to be healing well       Assessment & Plan:

## 2011-04-09 NOTE — Patient Instructions (Signed)
Increase morning insulin to 40units and night to 20 units.

## 2011-04-09 NOTE — Assessment & Plan Note (Signed)
Status post surgery, wound site healing well, pain is well-controlled, continue to follow clinically

## 2011-04-09 NOTE — Assessment & Plan Note (Addendum)
Patient's diabetes poorly controlled, increase insulin to 40 units in the morning and 20 units at night and recheck A1c in 3 months, also check for microalbumin/creatinine ratio, fasting lipids, and complete metabolic panel today.   Addendum: Patient has elevated microalbumin/creatinine ratio we'll start the patient on lisinopril 10 mg and recheck edema at next followup, also note her LDL is above goal 100, we will discuss starting a statin at next followup.

## 2011-04-10 LAB — MICROALBUMIN / CREATININE URINE RATIO: Microalb Creat Ratio: 334.3 mg/g — ABNORMAL HIGH (ref 0.0–30.0)

## 2011-04-10 MED ORDER — LISINOPRIL 10 MG PO TABS: 10.0000 mg | ORAL_TABLET | Freq: Every day | ORAL | Status: DC

## 2011-04-10 NOTE — Progress Notes (Signed)
Addended by: Darnelle Maffucci on: 04/10/2011 02:49 PM   Modules accepted: Orders

## 2011-04-18 ENCOUNTER — Encounter (INDEPENDENT_AMBULATORY_CARE_PROVIDER_SITE_OTHER): Payer: Self-pay

## 2011-05-05 ENCOUNTER — Telehealth: Payer: Self-pay | Admitting: *Deleted

## 2011-05-05 NOTE — Telephone Encounter (Addendum)
Pt states since starting lisinopril she has diarrhea and incontinence of diarrhea also an extreme amt of "gas", she stopped her metformin and these problems did not resolve, she has stopped the lisinopril and the problems are gone. appt 1/9 at 1415 dr Yaakov Guthrie

## 2011-05-05 NOTE — Telephone Encounter (Signed)
Fine

## 2011-05-07 ENCOUNTER — Ambulatory Visit: Payer: Self-pay | Admitting: Internal Medicine

## 2011-05-15 ENCOUNTER — Ambulatory Visit (INDEPENDENT_AMBULATORY_CARE_PROVIDER_SITE_OTHER): Payer: Self-pay | Admitting: Internal Medicine

## 2011-05-15 ENCOUNTER — Encounter: Payer: Self-pay | Admitting: Internal Medicine

## 2011-05-15 VITALS — BP 156/92 | HR 90 | Temp 97.5°F | Ht 64.0 in | Wt 241.4 lb

## 2011-05-15 DIAGNOSIS — E109 Type 1 diabetes mellitus without complications: Secondary | ICD-10-CM

## 2011-05-15 DIAGNOSIS — K521 Toxic gastroenteritis and colitis: Secondary | ICD-10-CM | POA: Insufficient documentation

## 2011-05-15 DIAGNOSIS — R197 Diarrhea, unspecified: Secondary | ICD-10-CM

## 2011-05-15 DIAGNOSIS — I10 Essential (primary) hypertension: Secondary | ICD-10-CM

## 2011-05-15 DIAGNOSIS — E119 Type 2 diabetes mellitus without complications: Secondary | ICD-10-CM

## 2011-05-15 LAB — GLUCOSE, CAPILLARY: Glucose-Capillary: 369 mg/dL — ABNORMAL HIGH (ref 70–99)

## 2011-05-15 MED ORDER — INSULIN NPH ISOPHANE & REGULAR (70-30) 100 UNIT/ML ~~LOC~~ SUSP: SUBCUTANEOUS | Status: DC

## 2011-05-15 NOTE — Patient Instructions (Signed)
Stop taking metformin. Continue taking lisinopril and your multivitimin. Try to take a women's vitamin that has folate (folic acid in it)    Return to clinic to see Dr. Gilford Rile in March as already scheduled Please bring all your medications to your next clinic appointment.    Diabetes and Exercise Regular exercise is important and can help:   Control blood glucose (sugar).   Decrease blood pressure.    Control blood lipids (cholesterol, triglycerides).   Improve overall health.  BENEFITS FROM EXERCISE  Improved fitness.   Improved flexibility.   Improved endurance.   Increased bone density.   Weight control.   Increased muscle strength.   Decreased body fat.   Improvement of the body's use of insulin, a hormone.   Increased insulin sensitivity.   Reduction of insulin needs.   Reduced stress and tension.   Helps you feel better.  People with diabetes who add exercise to their lifestyle gain additional benefits, including:  Weight loss.   Reduced appetite.   Improvement of the body's use of blood glucose.   Decreased risk factors for heart disease:   Lowering of cholesterol and triglycerides.   Raising the level of good cholesterol (high-density lipoproteins, HDL).   Lowering blood sugar.   Decreased blood pressure.  TYPE 1 DIABETES AND EXERCISE  Exercise will usually lower your blood glucose.   If blood glucose is greater than 240 mg/dl, check urine ketones. If ketones are present, do not exercise.   Location of the insulin injection sites may need to be adjusted with exercise. Avoid injecting insulin into areas of the body that will be exercised. For example, avoid injecting insulin into:   The arms when playing tennis.   The legs when jogging. For more information, discuss this with your caregiver.   Keep a record of:   Food intake.   Type and amount of exercise.   Expected peak times of insulin action.   Blood glucose levels.  Do  this before, during, and after exercise. Review your records with your caregiver. This will help you to develop guidelines for adjusting food intake and insulin amounts.  TYPE 2 DIABETES AND EXERCISE  Regular physical activity can help control blood glucose.   Exercise is important because it may:   Increase the body's sensitivity to insulin.   Improve blood glucose control.   Exercise reduces the risk of heart disease. It decreases serum cholesterol and triglycerides. It also lowers blood pressure.   Those who take insulin or oral hypoglycemic agents should watch for signs of hypoglycemia. These signs include dizziness, shaking, sweating, chills, and confusion.   Body water is lost during exercise. It must be replaced. This will help to avoid loss of body fluids (dehydration) or heat stroke.  Be sure to talk to your caregiver before starting an exercise program to make sure it is safe for you. Remember, any activity is better than none.  Document Released: 07/05/2003 Document Revised: 12/25/2010 Document Reviewed: 10/19/2008 [facility] Patient Information 2012 [place], [place].

## 2011-05-15 NOTE — Progress Notes (Signed)
Subjective:   Patient ID: Robin [name] female   DOB: 26-Nov-1970 41 y.o.   MRN: [id number]  HPI: [name] is a 41 y.o. woman with past medical history significant for diabetes mellitus and recent appendectomy on December 6. She presents for chief complaint diarrhea today.  She states that 6 hours after starting increased dose of metformin 1000MG  BID, she developed persistent diarrhea.   She has tried discontinuing metformin for a day or two but her diarrhea has persisted. In general, she feels well besides having the diarrhea. She denies any fevers, chills, nausea, vomiting or abdominal pain. She wonders if this could also be secondary to lisinopril which she started at the same time. She states her diarrhea has been so severe that she has had incontinence at night. She denies dizziness or orthostasis.   Past Medical History  Diagnosis Date  . Belching 02/18/2007  . Recurrent boils 07/08/2007  . Hypokalemia   . Anemia 11/06/2006    NOS  . Diabetes mellitus type 1 11/06/2006  . Vitamin D deficiency 02/20/2008  . Lumbar herniated disc 02/25/2008  . Elevated blood pressure 06/07/2008  . Dysmenorrhea 12/21/2008  . GERD (gastroesophageal reflux disease) 12/21/2008  . Tinea versicolor 12/21/2008    treated with ketoconazole  . Back pain 10/09    MRI with disc protrusion at L4-5 and L5-S1 with mild lateral recess encroachment on L5 nerve root; mod facet dz at L4-5 and L5-S1  . Hypertension    Current Outpatient Prescriptions  Medication Sig Dispense Refill  . insulin NPH-insulin regular (NOVOLIN 70/30) (70-30) 100 UNIT/ML injection 45 units in the morning And 20 units in PM  10 mL  3  . Cholecalciferol (VITAMIN D) 1000 UNITS capsule Take 1,000 Units by mouth daily.        . ferrous sulfate 325 (65 FE) MG tablet Take 325 mg by mouth 2 (two) times daily.        Marland Kitchen lisinopril (PRINIVIL,ZESTRIL) 10 MG tablet Take 1 tablet (10 mg total) by mouth daily.  30 tablet  11    Family History  Problem Relation Age of Onset  . Hypertension Mother    History   Social History  . Marital Status: Divorced    Spouse Name: N/A    Number of Children: N/A  . Years of Education: N/A   Social History Main Topics  . Smoking status: Former Games developer  . Smokeless tobacco: None   Comment: 1 - 2 cigs/day to cut back even further  . Alcohol Use: No  . Drug Use: No  . Sexually Active: None   Other Topics Concern  . None   Social History Narrative  . None   Review of Systems: Constitutional: Denies fever, chills, diaphoresis, appetite change and fatigue.  HEENT: Denies photophobia, eye pain, redness, hearing loss, ear pain, congestion, sore throat, rhinorrhea, sneezing, mouth sores, trouble swallowing, neck pain, neck stiffness and tinnitus.   Respiratory: Denies SOB, cough Cardiovascular: Denies chest pain, palpitations and leg swelling.  Gastrointestinal: Denies nausea, vomiting, abdominal pain, diarrhea, constipation, blood in stool and abdominal distention.  Genitourinary: Denies dysuria, urgency, frequency, hematuria, flank pain and difficulty urinating.  Musculoskeletal: Denies myalgias, back pain, joint swelling, arthralgias and gait problem.  Skin: Denies pallor, rash and wound.  Neurological: Denies dizziness, seizures, syncope, weakness, light-headedness, numbness and headaches.  Hematological: Denies adenopathy. Easy bruising, personal or family bleeding history  Psychiatric/Behavioral: Denies suicidal ideation, mood changes, confusion, nervousness, sleep disturbance and agitation  Objective:  Physical  Exam: Filed Vitals:   05/15/11 1534  BP: 156/92  Pulse: 90  Temp: 97.5 F (36.4 C)  TempSrc: Oral  Height: 5\' 4"  (1.626 m)  Weight: 241 lb 6.4 oz (109.498 kg)  SpO2: 100%   Constitutional: Vital signs reviewed.  Patient is a well-developed and well-nourished woman in no acute distress and cooperative with exam. Alert and oriented x3.  Head:  Normocephalic and atraumatic Eyes: PERRL, EOMI, conjunctivae normal, No scleral icterus.  Cardiovascular: RRR, S1 normal, S2 normal, no MRG, pulses symmetric and intact bilaterally Pulmonary/Chest: CTAB, no wheezes, rales, or rhonchi Abdominal: Soft. Non-tender, non-distended, bowel sounds are normal, no masses, organomegaly, or guarding present.  GU: no CVA tenderness Neurological: A&O x3, cranial nerves II through XII are grossly intact. No focal neurologic deficits. Skin: Warm, dry and intact. No rash, cyanosis, or clubbing.  Psychiatric: Normal mood and affect. speech and behavior is normal. Judgment and thought content normal. Cognition and memory are normal.   Assessment & Plan:

## 2011-05-19 NOTE — Assessment & Plan Note (Addendum)
The onset and timing of Robin [name] is diarrhea is consistent with diarrhea secondary to metformin. I informed her that this is a common side effect that can occur with increasing metformin use. Lisinopril is very unlikely to cause this side effect. I advised her to discontinue taking metformin. She is to continue to take lisinopril. As her diabetes is still relatively uncontrolled I informed her to increase her Novolin 70/30-45 units every morning and 20 units every evening. She is to followup with Dr. Birdena Crandall in February or March for regular diabetes followup. If her diarrhea is unresolved in one week she is to call and make an appointment to be seen in the clinic.

## 2011-05-20 NOTE — Progress Notes (Signed)
agree

## 2011-07-14 ENCOUNTER — Encounter: Payer: Self-pay | Admitting: Internal Medicine

## 2011-10-13 ENCOUNTER — Encounter (HOSPITAL_COMMUNITY): Payer: Self-pay | Admitting: Emergency Medicine

## 2011-10-13 ENCOUNTER — Emergency Department (HOSPITAL_COMMUNITY)
Admission: EM | Admit: 2011-10-13 | Discharge: 2011-10-13 | Disposition: A | Discharge status: Home / Self Care | Payer: Self-pay | Attending: Emergency Medicine | Admitting: Emergency Medicine

## 2011-10-13 DIAGNOSIS — L0231 Cutaneous abscess of buttock: Secondary | ICD-10-CM | POA: Insufficient documentation

## 2011-10-13 DIAGNOSIS — K219 Gastro-esophageal reflux disease without esophagitis: Secondary | ICD-10-CM | POA: Insufficient documentation

## 2011-10-13 DIAGNOSIS — I10 Essential (primary) hypertension: Secondary | ICD-10-CM | POA: Insufficient documentation

## 2011-10-13 DIAGNOSIS — L03317 Cellulitis of buttock: Secondary | ICD-10-CM | POA: Insufficient documentation

## 2011-10-13 DIAGNOSIS — Z794 Long term (current) use of insulin: Secondary | ICD-10-CM | POA: Insufficient documentation

## 2011-10-13 DIAGNOSIS — E119 Type 2 diabetes mellitus without complications: Secondary | ICD-10-CM | POA: Insufficient documentation

## 2011-10-13 DIAGNOSIS — IMO0002 Reserved for concepts with insufficient information to code with codable children: Secondary | ICD-10-CM | POA: Insufficient documentation

## 2011-10-13 DIAGNOSIS — W57XXXA Bitten or stung by nonvenomous insect and other nonvenomous arthropods, initial encounter: Secondary | ICD-10-CM

## 2011-10-13 DIAGNOSIS — R5383 Other fatigue: Secondary | ICD-10-CM

## 2011-10-13 DIAGNOSIS — Z87891 Personal history of nicotine dependence: Secondary | ICD-10-CM | POA: Insufficient documentation

## 2011-10-13 LAB — URINE MICROSCOPIC-ADD ON

## 2011-10-13 LAB — POCT I-STAT, CHEM 8
Creatinine, Ser: 0.7 mg/dL (ref 0.50–1.10)
Glucose, Bld: 287 mg/dL — ABNORMAL HIGH (ref 70–99)
Hemoglobin: 13.3 g/dL (ref 12.0–15.0)
Sodium: 138 mEq/L (ref 135–145)
TCO2: 25 mmol/L (ref 0–100)

## 2011-10-13 LAB — URINALYSIS, ROUTINE W REFLEX MICROSCOPIC
Bilirubin Urine: NEGATIVE
Protein, ur: 100 mg/dL — AB
Urobilinogen, UA: 0.2 mg/dL (ref 0.0–1.0)

## 2011-10-13 MED ORDER — BUPIVACAINE-EPINEPHRINE PF 0.5-1:200000 % IJ SOLN
20.0000 mL | Freq: Once | INTRAMUSCULAR | Status: DC
  Filled 2011-10-13: qty 20

## 2011-10-13 MED ORDER — SULFAMETHOXAZOLE-TRIMETHOPRIM 800-160 MG PO TABS: 1.0000 | ORAL_TABLET | Freq: Two times a day (BID) | ORAL | Status: AC

## 2011-10-13 NOTE — ED Notes (Signed)
Pt presenting to ed with c/o possible spider bite to left shoulder and right buttock x 3 days. Pt states she feels groggy at this time. Pt states her job made her come and get it checked out. Pt denies fever at this time. Pt states she thinks that they both popped on their on.

## 2011-10-13 NOTE — ED Provider Notes (Signed)
History     CSN: [id number]  Arrival date & time 10/13/11  1244   First MD Initiated Contact with Patient 10/13/11 1507      3:10 PM HPI Patient reports approximately 3 days ago developed 2 lumps: 1 on her left shoulder and the other on her right medial buttock. States "I think I've bitten by a spider". Reports that both abscesses drained on her own. Reports moderate tenderness with palpation. Denies erythema or large mass. Reports last time she was bitten by a spider she had significant swelling and mild erythema, infection and fatigue.  Patient is a 41 y.o. female presenting with abscess. The history is provided by the patient.  Abscess  This is a new problem. Episode onset: 3 Days. The onset was gradual. The problem occurs continuously. The problem has been gradually improving. The abscess is present on the left arm and right buttock. The problem is moderate. The abscess is characterized by painfulness, redness and draining. The patient was exposed to spider bites. Associated symptoms include decreased physical activity. Pertinent negatives include no fever, no vomiting, no congestion, no rhinorrhea, no sore throat and no cough. There were no sick contacts.    Past Medical History  Diagnosis Date  . Belching 02/18/2007  . Recurrent boils 07/08/2007  . Hypokalemia   . Anemia 11/06/2006    NOS  . Diabetes mellitus type 1 11/06/2006  . Vitamin d deficiency 02/20/2008  . Lumbar herniated disc 02/25/2008  . Elevated blood pressure 06/07/2008  . Dysmenorrhea 12/21/2008  . GERD (gastroesophageal reflux disease) 12/21/2008  . Tinea versicolor 12/21/2008    treated with ketoconazole  . Back pain 10/09    MRI with disc protrusion at L4-5 and L5-S1 with mild lateral recess encroachment on L5 nerve root; mod facet dz at L4-5 and L5-S1  . Hypertension     Past Surgical History  Procedure Date  . Ectopic pregnancy surgery 1996    rupture and laparoscopic  . Laparoscopic appendectomy  04/03/2011    Procedure: APPENDECTOMY LAPAROSCOPIC;  Surgeon: Jetty Duhamel, MD;  Location: [facility] OR;  Service: General;  Laterality: N/A;    Family History  Problem Relation Age of Onset  . Hypertension Mother     History  Substance Use Topics  . Smoking status: Former Games developer  . Smokeless tobacco: Not on file   Comment: 1 - 2 cigs/day to cut back even further  . Alcohol Use: 0.6 oz/week    1 Glasses of wine per week    OB History    Grav Para Term Preterm Abortions TAB SAB Ect Mult Living                  Review of Systems  Constitutional: Positive for fatigue. Negative for fever and chills.  HENT: Negative for congestion, sore throat and rhinorrhea.   Eyes: Negative for redness.  Respiratory: Negative for cough, shortness of breath and wheezing.   Cardiovascular: Negative for chest pain and palpitations.  Gastrointestinal: Negative for nausea and vomiting.  Skin:       Abscess  All other systems reviewed and are negative.    Allergies  Tramadol  Home Medications   Current Outpatient Rx  Name Route Sig Dispense Refill  . INSULIN ISOPHANE & REGULAR (70-30) 100 UNIT/ML [place] SUSP  45 units in the morning And 20 units in PM 10 mL 3  . LISINOPRIL 10 MG PO TABS Oral Take 1 tablet (10 mg total) by mouth daily. 30 tablet 11  BP 125/75  Pulse 89  Temp 98 F (36.7 C) (Oral)  Resp 16  SpO2 98%  LMP 09/21/2011  Physical Exam  Vitals reviewed. Constitutional: She is oriented to person, place, and time. Vital signs are normal. She appears well-developed and well-nourished. No distress.  HENT:  Head: Normocephalic and atraumatic.  Eyes: Pupils are equal, round, and reactive to light.  Neck: Neck supple.  Pulmonary/Chest: Effort normal.  Neurological: She is alert and oriented to person, place, and time.  Skin: Skin is warm and dry. No rash noted. No erythema. No pallor.       Small healing pustule on left scapula and right medial buttock. Tender to palpation. No  drainage. Mild localized erythema less than 1 cm. No palpable abscess.  Psychiatric: She has a normal mood and affect. Her behavior is normal.    ED Course  Procedures  Results for orders placed during the hospital encounter of 10/13/11  GLUCOSE, CAPILLARY      Component Value Range   Glucose-Capillary 290 (*) 70 - 99 mg/dL  URINALYSIS, ROUTINE W REFLEX MICROSCOPIC      Component Value Range   Color, Urine YELLOW  YELLOW   APPearance CLOUDY (*) CLEAR   Specific Gravity, Urine 1.032 (*) 1.005 - 1.030   pH 5.5  5.0 - 8.0   Glucose, UA >1000 (*) NEGATIVE mg/dL   Hgb urine dipstick MODERATE (*) NEGATIVE   Bilirubin Urine NEGATIVE  NEGATIVE   Ketones, ur NEGATIVE  NEGATIVE mg/dL   Protein, ur 161 (*) NEGATIVE mg/dL   Urobilinogen, UA 0.2  0.0 - 1.0 mg/dL   Nitrite NEGATIVE  NEGATIVE   Leukocytes, UA NEGATIVE  NEGATIVE  POCT PREGNANCY, URINE      Component Value Range   Preg Test, Ur NEGATIVE  NEGATIVE  URINE MICROSCOPIC-ADD ON      Component Value Range   Squamous Epithelial / LPF FEW (*) RARE   WBC, UA 3-6  <3 WBC/hpf   RBC / HPF 0-2  <3 RBC/hpf   Bacteria, UA MANY (*) RARE  POCT I-STAT, CHEM 8      Component Value Range   Sodium 138  135 - 145 mEq/L   Potassium 4.5  3.5 - 5.1 mEq/L   Chloride 103  96 - 112 mEq/L   BUN 20  6 - 23 mg/dL   Creatinine, Ser 0.96  0.50 - 1.10 mg/dL   Glucose, Bld 045 (*) 70 - 99 mg/dL   Calcium, Ion 4.09  8.11 - 1.32 mmol/L   TCO2 25  0 - 100 mmol/L   Hemoglobin 13.3  12.0 - 15.0 g/dL   HCT 91.4  78.2 - 95.6 %   No results found.   MDM   Patient has a history of diabetes. Areas of possible spider bite do not appear significantly infected. However will check i-STAT and UA since main complaint is fatigue due to spider bites.   4:34 PM Patient not in DKA however is elevated. Patient does not have urinary tract infection. Advised followup with primary care physician G2 elevated glucose. Advisedelevated glucose and cause symptoms of fatigue.  Prescribe antibiotic for mild cellulitis. Advised patient to use warm compresses. Patient voices understanding and is ready for discharge  Thomasene Lot, Cordelia Poche 10/13/11 1635

## 2011-10-13 NOTE — Discharge Instructions (Signed)
Insect Bite Mosquitoes, flies, fleas, bedbugs, and many other insects can bite. Insect bites are different from insect stings. A sting is when venom is injected into the skin. Some insect bites can transmit infectious diseases. SYMPTOMS  Insect bites usually turn red, swell, and itch for 2 to 4 days. They often go away on their own. TREATMENT  Your caregiver may prescribe antibiotic medicines if a bacterial infection develops in the bite. HOME CARE INSTRUCTIONS  Do not scratch the bite area.   Keep the bite area clean and dry. Wash the bite area thoroughly with soap and water.   Put ice or cool compresses on the bite area.   Put ice in a plastic bag.   Place a towel between your skin and the bag.   Leave the ice on for 20 minutes, 4 times a day for the first 2 to 3 days, or as directed.   You may apply a baking soda paste, cortisone cream, or calamine lotion to the bite area as directed by your caregiver. This can help reduce itching and swelling.   Only take over-the-counter or prescription medicines as directed by your caregiver.   If you are given antibiotics, take them as directed. Finish them even if you start to feel better.  You may need a tetanus shot if:  You cannot remember when you had your last tetanus shot.   You have never had a tetanus shot.   The injury broke your skin.  If you get a tetanus shot, your arm may swell, get red, and feel warm to the touch. This is common and not a problem. If you need a tetanus shot and you choose not to have one, there is a rare chance of getting tetanus. Sickness from tetanus can be serious. SEEK IMMEDIATE MEDICAL CARE IF:   You have increased pain, redness, or swelling in the bite area.   You see a red line on the skin coming from the bite.   You have a fever.   You have joint pain.   You have a headache or neck pain.   You have unusual weakness.   You have a rash.   You have chest pain or shortness of breath.   You  have abdominal pain, nausea, or vomiting.   You feel unusually tired or sleepy.  MAKE SURE YOU:   Understand these instructions.   Will watch your condition.   Will get help right away if you are not doing well or get worse.  Document Released: 05/22/2004 Document Revised: 04/03/2011 Document Reviewed: 11/13/2010 [employer] Patient Information 2012 [employer]. 

## 2011-10-14 NOTE — Progress Notes (Signed)
Pt listed as self pay with no insurance coverage Pt confirms she is self pay [place] resident CM and [facility] community liaison spoke with her Pt offered [facility] services to assist with finding a [place] self pay provider Pt accepted information   

## 2011-10-14 NOTE — ED Provider Notes (Signed)
Medical screening examination/treatment/procedure(s) were performed by non-physician practitioner and as supervising physician I was immediately available for consultation/collaboration.   Hurman Horn, MD 10/14/11 2300

## 2011-11-03 ENCOUNTER — Encounter: Payer: Self-pay | Admitting: Internal Medicine

## 2011-11-10 ENCOUNTER — Ambulatory Visit: Payer: Self-pay | Admitting: Internal Medicine

## 2012-05-06 ENCOUNTER — Encounter: Payer: Self-pay | Admitting: Internal Medicine

## 2012-09-16 ENCOUNTER — Encounter: Payer: Self-pay | Admitting: Internal Medicine

## 2012-09-16 ENCOUNTER — Ambulatory Visit (INDEPENDENT_AMBULATORY_CARE_PROVIDER_SITE_OTHER): Payer: Self-pay | Admitting: Internal Medicine

## 2012-09-16 VITALS — BP 153/88 | HR 86 | Temp 98.7°F | Ht 64.0 in | Wt 254.2 lb

## 2012-09-16 DIAGNOSIS — K219 Gastro-esophageal reflux disease without esophagitis: Secondary | ICD-10-CM

## 2012-09-16 DIAGNOSIS — E109 Type 1 diabetes mellitus without complications: Secondary | ICD-10-CM

## 2012-09-16 DIAGNOSIS — M545 Low back pain, unspecified: Secondary | ICD-10-CM

## 2012-09-16 DIAGNOSIS — G8929 Other chronic pain: Secondary | ICD-10-CM

## 2012-09-16 LAB — BASIC METABOLIC PANEL WITH GFR
CO2: 28 mEq/L (ref 19–32)
Calcium: 9.5 mg/dL (ref 8.4–10.5)
Creat: 0.72 mg/dL (ref 0.50–1.10)
Glucose, Bld: 260 mg/dL — ABNORMAL HIGH (ref 70–99)

## 2012-09-16 LAB — HM DIABETES EYE EXAM

## 2012-09-16 LAB — LIPID PANEL
LDL Cholesterol: 101 mg/dL — ABNORMAL HIGH (ref 0–99)
VLDL: 22 mg/dL (ref 0–40)

## 2012-09-16 MED ORDER — INSULIN NPH ISOPHANE & REGULAR (70-30) 100 UNIT/ML ~~LOC~~ SUSP: SUBCUTANEOUS | Status: AC

## 2012-09-16 MED ORDER — ENALAPRIL MALEATE 20 MG PO TABS: 20.0000 mg | ORAL_TABLET | Freq: Every day | ORAL | Status: DC

## 2012-09-16 MED ORDER — OMEPRAZOLE 20 MG PO TBEC: 1.0000 | DELAYED_RELEASE_TABLET | Freq: Two times a day (BID) | ORAL | Status: AC

## 2012-09-16 NOTE — Assessment & Plan Note (Addendum)
Lab Results  Component Value Date   HGBA1C 9.6 09/16/2012   HGBA1C 9.7* 04/04/2011   HGBA1C 10.3 11/27/2009     Assessment: Diabetes control: poor control (HgbA1C >9%) Progress toward A1C goal:  unable to assess Comments:   Plan: Medications:  See below Home glucose monitoring: Frequency: 3 times a day Timing: before breakfast;at bedtime;before meals Instruction/counseling given: reminded to bring blood glucose meter & log to each visit, reminded to bring medications to each visit, discussed foot care, discussed the need for weight loss and discussed diet Educational resources provided: brochure Self management tools provided: home glucose logbook Other plans:   Challenging situation as patient has poor glycemic control but with frequent episodes of hypoglycemia. Mostly at night after 11 PM, sometimes during the day as well. Plan was discussed with Dr. Aundria Rud  Plan is to decrease 70/30 to 15-20 units at night, 40-45 units in the morning, will let patient decided based on her blood sugar. She will use the lower number if she continues to have hypoglycemia, she can use a higher number if the snacking prevent hypoglycemia. I also encouraged her to take more frequent snacks during the day to prevent hypoglycemia. Encouraged patient to eat at bedtime snack  -Retinal cam today -Urine micro-albumin/creatinine ratio -Lipid profile -BMP  *Patient to return to clinic in 2 weeks to make an appointment with the [facility] physician as well as our diabetes educator. Patient is very open to meeting with Lupita Leash as she would like to get her diabetes under better control.

## 2012-09-16 NOTE — Progress Notes (Signed)
Patient ID: Robin [name], female   DOB: [birth year], 42 y.o.   MRN: [id number]  Internal Medicine Clinic Visit    HPI:  Robin [name] is a 42 y.o. year old female with a history of type 1 diabetes, hypertension, GERD, who has not been to our clinic in followup for over a year.  1. Diabetes She has been taking 70/30 over-the-counter, 45 in the morning, 25 in the evening. She states that she takes them with meals, however, she frequently gets hypoglycemia from 11 PM to 3 AM. She frequently wakes up feeling shaky, sick and her sugars are often in the 30s and 40s. She states that she sometimes eats light dinners and light meals and has improved her diet recently. She does occasionally get hypoglycemic in the morning, but she does not eat very much for breakfast. She endorses urinary frequency but only when her blood sugar is high. She has been trying to watch her diet and has been eating more balanced meals and less fried foods recently. She has gained about 10 pounds over the past year.  2. Acid reflux, belching, flatulence Patient states that her reflux has been worse than usual recently. She has symptoms of burning in her chest, sour taste in her mouth, epigastric and burning chest pain after meals. She also has a lot of gas and has frequent belching during the day and at night. Flatulence is very frequent as well. This has been going on for several months, getting worse. Last week she started taking Zantac over-the-counter which has helped with the burning chest pain but not belching. She denies any weight loss, melena, hematochezia, hematemesis. Patient also states that she feels full quite quickly after eating just a few bites of food.  3. Back pain Patient states that she had a herniated disc found that her back and she has had chronic low back pain and right thigh pain for 6 years. She states that it flares up every once in a while and the pain is worse in the mornings. She often feels a  pull in her lower back and has to be careful with lifting items and bending over.   She denies any diarrhea, she has occasional constipation, sometimes as bad as one bowel movement per week. She denies any dysuria, fever, chills. She denies any foul smelling stool, denies any fat in her stool or in the toilet bowl  Appendix removed Dec 2012.     Past Medical History  Diagnosis Date  . Belching 02/18/2007  . Recurrent boils 07/08/2007  . Hypokalemia   . Anemia 11/06/2006    NOS  . Diabetes mellitus type 1 11/06/2006  . Vitamin d deficiency 02/20/2008  . Lumbar herniated disc 02/25/2008  . Elevated blood pressure 06/07/2008  . Dysmenorrhea 12/21/2008  . GERD (gastroesophageal reflux disease) 12/21/2008  . Tinea versicolor 12/21/2008    treated with ketoconazole  . Back pain 10/09    MRI with disc protrusion at L4-5 and L5-S1 with mild lateral recess encroachment on L5 nerve root; mod facet dz at L4-5 and L5-S1  . Hypertension     Past Surgical History  Procedure Laterality Date  . Ectopic pregnancy surgery  1996    rupture and laparoscopic  . Laparoscopic appendectomy  04/03/2011    Procedure: APPENDECTOMY LAPAROSCOPIC;  Surgeon: Jetty Duhamel, MD;  Location: [facility] OR;  Service: General;  Laterality: N/A;     ROS:  A complete review of systems was otherwise negative, except as  noted in the HPI.  Allergies: Tramadol  Medications: Current Outpatient Prescriptions  Medication Sig Dispense Refill  . insulin NPH-insulin regular (NOVOLIN 70/30) (70-30) 100 UNIT/ML injection 45 units in the morning And 20 units in PM  10 mL  3  . lisinopril (PRINIVIL,ZESTRIL) 10 MG tablet Take 1 tablet (10 mg total) by mouth daily.  30 tablet  11   No current facility-administered medications for this visit.    History   Social History  . Marital Status: Divorced    Spouse Name: N/A    Number of Children: N/A  . Years of Education: N/A   Occupational History  . Not on file.    Social History Main Topics  . Smoking status: Former Games developer  . Smokeless tobacco: Not on file     Comment: 1 - 2 cigs/day to cut back even further  . Alcohol Use: 0.6 oz/week    1 Glasses of wine per week  . Drug Use: No  . Sexually Active: Not on file   Other Topics Concern  . Not on file   Social History Narrative  . No narrative on file    family history includes Hypertension in her mother.  Physical Exam Blood pressure 153/88, pulse 86, temperature 98.7 F (37.1 C), temperature source Oral, height 5\' 4"  (1.626 m), weight 254 lb 3.2 oz (115.304 kg), SpO2 94.00%. General:  No acute distress, obese, alert and oriented x 3, well-appearing AAF HEENT:  PERRL, EOMI, no lymphadenopathy, moist mucous membranes Cardiovascular:  Regular rate and rhythm, no murmurs, rubs or gallops Respiratory:  Clear to auscultation bilaterally, no wheezes, rales, or rhonchi Abdomen:  Soft, obese, nondistended, nontender, normoactive bowel sounds, no rebound or guarding, no suprapubic pain Extremities:  Warm and well-perfused, no clubbing, cyanosis, or edema. Decreased sensation to monofilament over parts of feet Skin: Warm, dry, no rashes, several tattoos throughout body Neuro: Not anxious appearing, no depressed mood, normal affect  Labs: Lab Results  Component Value Date   CREATININE 0.70 10/13/2011   BUN 20 10/13/2011   NA 138 10/13/2011   K 4.5 10/13/2011   CL 103 10/13/2011   CO2 25 04/09/2011   Lab Results  Component Value Date   WBC 13.9* 04/06/2011   HGB 13.3 10/13/2011   HCT 39.0 10/13/2011   MCV 86.3 04/06/2011   PLT 313 04/06/2011      Assessment and Plan:    FOLLOWUP: FARON WHITELOCK will follow back up in our clinic in approximately 2 weeks. CARI BURGO knows to call out clinic in the meantime with any questions or new issues.

## 2012-09-16 NOTE — Assessment & Plan Note (Signed)
Patient with severe bothersome GERD symptoms, including belching, sour taste in mouth, burning chest and epigastric pain after meals. No red flag symptoms of weight loss, abdominal tenderness, rebound, vomiting. I also asked patient to look for certain food triggers and keep a food diary. Particularly in regards to lactose and wheat products.  -Encouraged patient to avoid reflux exacerbated her such as caffeine, fatty foods, acidic foods, smoking -Will try omeprazole 20 mg twice daily, 30 minutes before meals for at least 2 weeks and then we can reassess symptoms

## 2012-09-16 NOTE — Assessment & Plan Note (Signed)
Patient has had chronic low back pain since 2006, no changes in it recently. No urinary incontinence, no weight loss, no other red flag symptoms.  -Patient is addressing this problem at a later visit, she was not requesting narcotics or any medications necessarily during this visit -We need to continue to encourage weight loss and exercise strengthening -May consider physical therapy in the future, patient has no insurance at this time

## 2012-09-16 NOTE — Patient Instructions (Signed)
General Instructions:   Please return to clinic in 2 weeks ago for lab results and followup on blood pressure and diabetes  Also make an appointment with Lupita Leash Plyler our diabetes educator on the same day as her next appointment.  Please go to your pharmacy and pick up your medications   Treatment Goals:  Goals (1 Years of Data) as of 09/16/12         As of Today 10/13/11 10/13/11 05/15/11 04/09/11     Blood Pressure    . Blood Pressure < 140/90  153/88 138/89 125/75 156/92      Result Component    . HEMOGLOBIN A1C < 7.0  9.6        . LDL CALC < 100      129      Progress Toward Treatment Goals:  Treatment Goal 09/16/2012  Hemoglobin A1C unable to assess    Self Care Goals & Plans:  Self Care Goal 09/16/2012  Manage my medications take my medicines as prescribed; bring my medications to every visit; refill my medications on time  Monitor my health bring my glucose meter and log to each visit; keep track of my blood glucose; check my feet daily  Eat healthy foods drink diet soda or water instead of juice or soda; eat foods that are low in salt; eat smaller portions; eat baked foods instead of fried foods  Be physically active find an activity I enjoy    Home Blood Glucose Monitoring 09/16/2012  Check my blood sugar 3 times a day  When to check my blood sugar before breakfast; at bedtime; before meals     Care Management & Community Referrals:  Referral 09/16/2012  Referrals made for care management support diabetes educator

## 2012-09-17 LAB — MICROALBUMIN / CREATININE URINE RATIO
Creatinine, Urine: 57.2 mg/dL
Microalb Creat Ratio: 921.2 mg/g — ABNORMAL HIGH (ref 0.0–30.0)
Microalb, Ur: 52.69 mg/dL — ABNORMAL HIGH (ref 0.00–1.89)

## 2012-09-17 NOTE — Progress Notes (Signed)
Case discussed with Dr.Kesty soon after the resident saw the patient.  We reviewed the resident's history and exam and pertinent patient test results.  I agree with the assessment, diagnosis and plan of care documented in the resident's note. 

## 2012-09-30 ENCOUNTER — Ambulatory Visit (INDEPENDENT_AMBULATORY_CARE_PROVIDER_SITE_OTHER): Payer: Self-pay | Admitting: Dietician

## 2012-09-30 ENCOUNTER — Encounter: Payer: Self-pay | Admitting: Internal Medicine

## 2012-09-30 ENCOUNTER — Ambulatory Visit (INDEPENDENT_AMBULATORY_CARE_PROVIDER_SITE_OTHER): Payer: Self-pay | Admitting: Internal Medicine

## 2012-09-30 VITALS — BP 120/70 | HR 80 | Temp 97.5°F | Ht 64.25 in | Wt 250.0 lb

## 2012-09-30 DIAGNOSIS — E109 Type 1 diabetes mellitus without complications: Secondary | ICD-10-CM

## 2012-09-30 DIAGNOSIS — R030 Elevated blood-pressure reading, without diagnosis of hypertension: Secondary | ICD-10-CM

## 2012-09-30 DIAGNOSIS — R5383 Other fatigue: Secondary | ICD-10-CM

## 2012-09-30 DIAGNOSIS — R5381 Other malaise: Secondary | ICD-10-CM

## 2012-09-30 DIAGNOSIS — N946 Dysmenorrhea, unspecified: Secondary | ICD-10-CM

## 2012-09-30 LAB — TSH: TSH: 1.155 u[IU]/mL (ref 0.350–4.500)

## 2012-09-30 LAB — BASIC METABOLIC PANEL WITH GFR
Calcium: 9.4 mg/dL (ref 8.4–10.5)
Creat: 0.88 mg/dL (ref 0.50–1.10)
GFR, Est African American: >89 mL/min
GFR, Est Non African American: 82 mL/min

## 2012-09-30 NOTE — Assessment & Plan Note (Addendum)
Patient complains of fatigue for past two weeks " since I started Enalapril". The physical examinations are unremarkable except for obesity and mild thyromegaly. Per chart review, she has weight gain ~ 50 lbs over past 2-3 years.   The differential diagnoses of fatigue is a broad including thyroid disorder, anemia, depression, sleep apnea, diabetes, substance abuse, and chronic fatigue syndrome.   - She has uncontrolled diabetes which could contribute to her fatigue.  Will need to rule out hypothyroidism given her weight gain and mild thyromegaly on examination.  will check BMP for followup of initiation of ACE inhibitor.  I reviewed the drug side effect of Enalapril and fatigue is one of the adverse reaction.  However I think this is a diagnosis of exclusion.  We'll need to rule out other etiologies.  - Will likely send her for a sleep study if all her blood work is negative.  Patient does endorse occasional snoring at nighttime.   Addendum - Her BMP and TSH is unremarkable. - will repeat her CBC during next office visit - With her HGB A1c 9.6, her average CBGS are 230. Her uncontrolled Dm could certainly contribute to her fatigue. Will need to manage her DM closely and hopeful to normalize HGB within next few months.  Will only consider sleep study if she c/o excessive snoring and dailytime sleeping.

## 2012-09-30 NOTE — Patient Instructions (Addendum)
1. followup in 2-3 weeks 2 Will send referral for gynecology

## 2012-09-30 NOTE — Progress Notes (Signed)
Medical Nutrition Therapy:  Appt start time: 1135 end time:  1215.  Assessment:  Primary concerns today: Weight management and Blood sugar control.   Usual eating pattern includes 3 meals and 0 snacks per day. Usual physical activity includes reports inactive job, but active in dance groups and other activities. . Takes insulin religiously, reports fasting low 200s since decrease in PM insulin to prevent nocturnal hypoglycemia. No meter/home blood sugar records today.  NOTE- patient reports has been taking PM insulin AFTER eating.  Intake estimated at least 2000 calories/day  Progress Towards Goal(s):  In progress.   Nutritional Diagnosis:  NB-1.1 Food and nutrition-related knowledge deficit As related to  unaware of calorie content of food.  As evidenced by her report and weight gain of 50# over past few years. .    Intervention:  Nutrition education about insulin action and meters Nutrition education about calorie content of foods.  Monitoring/Evaluation:  Dietary intake, exercise, blood sugar, and body weight 3-4 weeks.

## 2012-09-30 NOTE — Progress Notes (Signed)
Subjective:   Patient ID: Robin [name] female   DOB: 1970-05-10 41 y.o.   MRN: [id number]  HPI: Robin [name] is a 42 y.o. woman with past history of diabetes, vitamin D deficiency, Ativan blood pressure and GERD who presents to the clinic for followup visit.  Patient was evaluated by her PCP Dr. Collier Bullock on 09/16/2012.  She was started with Enalapril 20 mg daily.  Patient states feeling tired and fatigued over last 2 weeks.  She would like to know whether Enalapril causes her tiredness.    Of note, Her insulin regimen was decreased due to frequent hypoglycemia events.  She is doing fine and is here to see her diabetic educator Lupita Leash as well.   Past Medical History  Diagnosis Date  . Belching 02/18/2007  . Recurrent boils 07/08/2007  . Hypokalemia   . Anemia 11/06/2006    NOS  . Diabetes mellitus type 1 11/06/2006  . Vitamin D deficiency 02/20/2008  . Lumbar herniated disc 02/25/2008  . Elevated blood pressure 06/07/2008  . Dysmenorrhea 12/21/2008  . GERD (gastroesophageal reflux disease) 12/21/2008  . Tinea versicolor 12/21/2008    treated with ketoconazole  . Back pain 10/09    MRI with disc protrusion at L4-5 and L5-S1 with mild lateral recess encroachment on L5 nerve root; mod facet dz at L4-5 and L5-S1  . Hypertension    Current Outpatient Prescriptions  Medication Sig Dispense Refill  . enalapril (VASOTEC) 20 MG tablet Take 1 tablet (20 mg total) by mouth daily.  30 tablet  6  . insulin NPH-regular (NOVOLIN 70/30) (70-30) 100 UNIT/ML injection 45 units in the morning And 15 units in PM  10 mL  3  . Omeprazole ([facility] OMEPRAZOLE) 20 MG TBEC Take 1 tablet (20 mg total) by mouth 2 (two) times daily before a meal.  30 each    . lisinopril (PRINIVIL,ZESTRIL) 10 MG tablet Take 1 tablet (10 mg total) by mouth daily.  30 tablet  11   No current facility-administered medications for this visit.   Family History  Problem Relation Age of Onset  . Hypertension Mother     History   Social History  . Marital Status: Divorced    Spouse Name: N/A    Number of Children: N/A  . Years of Education: N/A   Social History Main Topics  . Smoking status: Former Games developer  . Smokeless tobacco: None     Comment: 1 - 2 cigs/day to cut back even further  . Alcohol Use: 0.6 oz/week    1 Glasses of wine per week  . Drug Use: No  . Sexually Active: None   Other Topics Concern  . None   Social History Narrative  . None   Review of Systems: Review of Systems:  Constitutional:  Denies fever, chills, diaphoresis, appetite change and positive for fatigue.   HEENT:  Denies congestion, sore throat, rhinorrhea, sneezing, mouth sores, trouble swallowing, neck pain   Respiratory:  Denies SOB, DOE, cough, and wheezing.   Cardiovascular:  Denies palpitations and leg swelling.   Gastrointestinal:  Denies nausea, vomiting, abdominal pain, diarrhea, constipation, blood in stool and abdominal distention.   Genitourinary:  Denies dysuria, urgency, frequency, hematuria, flank pain and difficulty urinating.   Musculoskeletal:  Denies myalgias, back pain, joint swelling, arthralgias and gait problem.   Skin:  Denies pallor, rash and wound.   Neurological:  Denies dizziness, seizures, syncope, weakness, light-headedness, numbness and headaches.    .    Objective:  Physical Exam: Filed Vitals:   09/30/12 1045 09/30/12 1129  BP: 145/78 120/70  Pulse: 80   Temp: 97.5 F (36.4 C)   TempSrc: Oral   Height: 5' 4.25" (1.632 m)   Weight: 250 lb (113.399 kg)   SpO2: 100%    General: alert, well-developed, and cooperative to examination.  Head: normocephalic and atraumatic.  Eyes: vision grossly intact, pupils equal, pupils round, pupils reactive to light, no injection and anicteric.  Mouth: pharynx pink and moist, no erythema, and no exudates.  Neck: supple, full ROM, Mild thyromegaly, symmetric ,soft to rubbery texture, no tenderness or nodule appreciated.  No bruits noted.  no JVD, and no carotid bruits.  Lungs: normal respiratory effort, no accessory muscle use, normal breath sounds, no crackles, and no wheezes. Heart: normal rate, regular rhythm, no murmur, no gallop, and no rub.  Abdomen: soft, non-tender, normal bowel sounds, no distention, no guarding, no rebound tenderness, no hepatomegaly, and no splenomegaly.  Msk: no joint swelling, no joint warmth, and no redness over joints.  Pulses: 2+ DP/PT pulses bilaterally Extremities: No cyanosis, clubbing, edema Neurologic: alert & oriented X3, cranial nerves II-XII intact, strength normal in all extremities, sensation intact to light touch, and gait normal.  Skin: turgor normal and no rashes.  Psych: Oriented X3, memory intact for recent and remote, normally interactive, good eye contact, not anxious appearing, and not depressed appearing.    Assessment & Plan:

## 2012-09-30 NOTE — Assessment & Plan Note (Signed)
Blood pressure well controlled on ACE inhibitor.   - Continue current regimen.

## 2012-09-30 NOTE — Progress Notes (Signed)
INTERNAL MEDICINE TEACHING ATTENDING ADDENDUM: I discussed this case with Dr. Dierdre Searles soon after the patient visit. I have read the documentation and I agree with the plan of care. Please see the resident note for details of management.

## 2012-09-30 NOTE — Patient Instructions (Addendum)
Meter- [employer]  Weight loss- take PM insulin before dinner                       Less Guacomole and less chips- more veggies- eat your [place] sandwich- throw in a fruit and veggie and that's a great meal!  Please make a follow up appointment for 3-4 weeks

## 2012-09-30 NOTE — Assessment & Plan Note (Addendum)
Patient has seen her Gynocologist in the past for evaluation of dysmenorrhea.     - Will send referral

## 2012-10-11 ENCOUNTER — Emergency Department (HOSPITAL_COMMUNITY): Admission: EM | Admit: 2012-10-11 | Discharge: 2012-10-11 | Discharge status: Absent without leave | Payer: Self-pay

## 2012-10-11 NOTE — Addendum Note (Signed)
Addended by: Bufford Spikes on: 10/11/2012 03:36 PM   Modules accepted: Orders

## 2012-10-21 ENCOUNTER — Encounter: Payer: Self-pay | Admitting: Internal Medicine

## 2012-10-22 ENCOUNTER — Encounter: Payer: Self-pay | Admitting: Internal Medicine

## 2012-10-22 ENCOUNTER — Ambulatory Visit (INDEPENDENT_AMBULATORY_CARE_PROVIDER_SITE_OTHER): Payer: Self-pay | Admitting: Internal Medicine

## 2012-10-22 VITALS — BP 151/84 | HR 81 | Temp 97.5°F | Ht 64.25 in | Wt 255.6 lb

## 2012-10-22 DIAGNOSIS — E109 Type 1 diabetes mellitus without complications: Secondary | ICD-10-CM

## 2012-10-22 DIAGNOSIS — E11319 Type 2 diabetes mellitus with unspecified diabetic retinopathy without macular edema: Secondary | ICD-10-CM | POA: Insufficient documentation

## 2012-10-22 DIAGNOSIS — R5381 Other malaise: Secondary | ICD-10-CM

## 2012-10-22 MED ORDER — ENALAPRIL MALEATE 10 MG PO TABS: 10.0000 mg | ORAL_TABLET | Freq: Every day | ORAL | Status: AC

## 2012-10-22 NOTE — Patient Instructions (Addendum)
1. Will decrease enalapril to 10 mg po daily 2. Please schedule a follow up appt with your PCP for pap smear. 3. Please follow up appt for DM. in August

## 2012-10-22 NOTE — Assessment & Plan Note (Signed)
Patient reports that her fatigue is completely resolved.  - no further work up indicated.

## 2012-10-22 NOTE — Assessment & Plan Note (Addendum)
Patient c/o blurry vision when taking enalapril.  She reports that her blurry vision is completely resolved as soon as she stopped enalapril.  She states that her blurry vision is not associated with her high blood sugars.  -Detailed instructions discussed with patient and with regards to her diabetic retinopathy. - I discussed with patient that her blurry vision is most likely associated with her diabetic retinopathy instead of enalapril. -Patient would like to take a low dose of enalapril to see if it will work for her. - Will decrease her enalapril to 10 mg daily.  Her blood pressure is 151/84, and I think that we may be achieve optimal control with low-dose enalapril.  May consider adding HCTZ if indicated in the future.   Addendum Will send for ophthalmology referral for DR.

## 2012-10-22 NOTE — Progress Notes (Signed)
Subjective:   Patient ID: Robin [name] female   DOB: 1970-06-14 41 y.o.   MRN: [id number] Chief complaints: blurry vision HPI: [name] is a 42 y.o. woman with past history of diabetes, vitamin D deficiency, elevated blood pressure and GERD who presents to the clinic for followup visit.  Patient was evaluated by her PCP Dr. Collier Bullock on 09/16/2012.  She was started with Enalapril 20 mg daily.  Patient states that she has not taken it for last 2 weeks due to blurry vision. She states that her blurry vision is resolved as soon as she stopped enalapril.   Of note, she had retina camera done on 09/16/12 revealing left eye moderate DR and right eye mild DR.    Past Medical History  Diagnosis Date  . Belching 02/18/2007  . Recurrent boils 07/08/2007  . Hypokalemia   . Anemia 11/06/2006    NOS  . Diabetes mellitus type 1 11/06/2006  . Vitamin D deficiency 02/20/2008  . Lumbar herniated disc 02/25/2008  . Elevated blood pressure 06/07/2008  . Dysmenorrhea 12/21/2008  . GERD (gastroesophageal reflux disease) 12/21/2008  . Tinea versicolor 12/21/2008    treated with ketoconazole  . Back pain 10/09    MRI with disc protrusion at L4-5 and L5-S1 with mild lateral recess encroachment on L5 nerve root; mod facet dz at L4-5 and L5-S1  . Hypertension    Current Outpatient Prescriptions  Medication Sig Dispense Refill  . enalapril (VASOTEC) 20 MG tablet Take 1 tablet (20 mg total) by mouth daily.  30 tablet  6  . insulin NPH-regular (NOVOLIN 70/30) (70-30) 100 UNIT/ML injection 45 units in the morning And 15 units in PM  10 mL  3  . Omeprazole ([facility] OMEPRAZOLE) 20 MG TBEC Take 1 tablet (20 mg total) by mouth 2 (two) times daily before a meal.  30 each     No current facility-administered medications for this visit.   Family History  Problem Relation Age of Onset  . Hypertension Mother    History   Social History  . Marital Status: Divorced    Spouse Name: N/A    Number  of Children: N/A  . Years of Education: N/A   Social History Main Topics  . Smoking status: Former Games developer  . Smokeless tobacco: None     Comment: 1 - 2 cigs/day to cut back even further  . Alcohol Use: 0.6 oz/week    1 Glasses of wine per week  . Drug Use: No  . Sexually Active: None   Other Topics Concern  . None   Social History Narrative  . None   Review of Systems: Review of Systems:  Constitutional:  Denies fever, chills, diaphoresis, appetite change and fatigue.   HEENT:  Denies congestion, sore throat, rhinorrhea, sneezing, mouth sores, trouble swallowing, neck pain   Respiratory:  Denies SOB, DOE, cough, and wheezing.   Cardiovascular:  Denies palpitations and leg swelling.   Gastrointestinal:  Denies nausea, vomiting, abdominal pain, diarrhea, constipation, blood in stool and abdominal distention.   Genitourinary:  Denies dysuria, urgency, frequency, hematuria, flank pain and difficulty urinating.   Musculoskeletal:  Denies myalgias, back pain, joint swelling, arthralgias and gait problem.   Skin:  Denies pallor, rash and wound.   Neurological:  Denies dizziness, seizures, syncope, weakness, light-headedness, numbness and headaches. positive for blurry vision   .    Objective:  Physical Exam: Filed Vitals:   10/22/12 1330  BP: 151/84  Pulse: 81  Temp: 97.5  F (36.4 C)  TempSrc: Oral  Height: 5' 4.25" (1.632 m)  Weight: 255 lb 9.6 oz (115.939 kg)  SpO2: 98%   General: alert, well-developed, and cooperative to examination.  Head: normocephalic and atraumatic.  Eyes: vision grossly intact, pupils equal, pupils round, pupils reactive to light, no injection and anicteric.  Mouth: pharynx pink and moist, no erythema, and no exudates.  Neck: supple, full ROM, Mild thyromegaly, symmetric ,soft to rubbery texture, no tenderness or nodule appreciated.  No bruits noted. no JVD, and no carotid bruits.  Lungs: normal respiratory effort, no accessory muscle use, normal  breath sounds, no crackles, and no wheezes. Heart: normal rate, regular rhythm, no murmur, no gallop, and no rub.  Abdomen: soft, non-tender, normal bowel sounds, no distention, no guarding, no rebound tenderness, no hepatomegaly, and no splenomegaly.  Msk: no joint swelling, no joint warmth, and no redness over joints.  Pulses: 2+ DP/PT pulses bilaterally Extremities: No cyanosis, clubbing, edema Neurologic: alert & oriented X3, cranial nerves II-XII intact, strength normal in all extremities, sensation intact to light touch, and gait normal.  Skin: turgor normal and no rashes.  Psych: Oriented X3, memory intact for recent and remote, normally interactive, good eye contact, not anxious appearing, and not depressed appearing.    Assessment & Plan:

## 2012-10-25 NOTE — Addendum Note (Signed)
Addended byDierdre Searles, Atianna Haidar on: 10/25/2012 06:55 PM   Modules accepted: Orders

## 2012-10-25 NOTE — Progress Notes (Signed)
Case discussed with Dr. Dierdre Searles soon after the resident saw the patient.  We reviewed the resident's history and exam and pertinent patient test results.  I agree with the assessment, diagnosis, and plan of care documented in the resident's note, with the following additional comment.  Would refer patient to an ophthalmologist for evaluation of her diabetic retinopathy.

## 2012-11-05 ENCOUNTER — Encounter: Payer: Self-pay | Admitting: Dietician

## 2012-11-05 ENCOUNTER — Ambulatory Visit: Payer: Self-pay

## 2012-11-05 ENCOUNTER — Ambulatory Visit (INDEPENDENT_AMBULATORY_CARE_PROVIDER_SITE_OTHER): Payer: Self-pay | Admitting: Dietician

## 2012-11-05 VITALS — Wt 251.3 lb

## 2012-11-05 DIAGNOSIS — E109 Type 1 diabetes mellitus without complications: Secondary | ICD-10-CM

## 2012-11-05 NOTE — Progress Notes (Signed)
Medical Nutrition Therapy:  Appt start time: 1000 end time:  1030. Visit number 2  Assessment:  Primary concerns today: Weight management and Blood sugar control.   Patient has put some lifestyle modifications into place, specifically increase in physical activity and healthier food choices. Changes have been implemented for about 2 weeks and patient has lost 4 pounds since last visit. Goal now would be to assist patient in maintaining new behaviors and making improvements over time.  Usual eating pattern: See recall below. Usual physical activity includes: reports inactive job, walking 2-5 miles a week 4-6 days a week for 2 weeks. Also doing some light weight lifting and aerobics classes at the gym. 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 Takes insulin religiously, patient is taking blood sugar using meter and recording it in a log book, which patient brought with her to the appointment.  Patient is now taking evening insulin  before dinner most days. Patient's glucose log showed one low of uncertain origin in the morning. Overall blood sugars are trending towards less variability.  Intake estimated: 6-7:00 a.m. Snack: protein smoothie made with fruit and vegetables - consumed before walking then light breakfast after exercise 9:00 a.m. Breakfast: grits, egg whites, [place] sausage, sometimes cheese. 12-1 Lunch: Salad or another smoothie (without protein powder) 4-5 pm Snack: crackers, peanuts/trailmix 9:00 pm Dinner: [place] sandwich  Patient is very excited and motivated at visit, she enjoys exercising and the way it makes her feel and she says that she feels bad on days when she does not get to walk in the morning. Patient says that she would like to lose 100 lbs in a year (July 2015, goal weight 155 lbs.), her motivation is to be healthier, gain better control of her blood sugar, and to avoid bariatric surgery for weight loss.  Patient rates her current health at a 10 out of  10   Progress Towards Goal(s):  Some progress.    Nutritional Diagnosis:  NB-1.1 Food and nutrition-related knowledge deficit As related to  unaware of calorie content of food.  As evidenced by her report and weight gain of 50# over past few years. .    Intervention:   -Help patient develop a plan to be active even on days when she is unable to get to the park to walk. Gave her 2 phone apps to use, Lose It and 7-minute workout.  Monitoring/Evaluation:  Dietary intake, exercise, blood sugar, and body weight 3-4 weeks.

## 2012-11-05 NOTE — Patient Instructions (Addendum)
Great job with weight loss of 4 LBS and blood sugars!  Please make a follow up appointment for 3-4 weeks  To work on on the next few weeks- maintaining healthy intake, exercise, checking blood sugars before meals, when the result if too high or too low or one you don;t understand.   Can use Lose it to track activity on non - exercise days.

## 2012-11-11 ENCOUNTER — Encounter: Payer: Self-pay | Admitting: Obstetrics & Gynecology

## 2012-11-19 ENCOUNTER — Ambulatory Visit: Payer: Self-pay

## 2012-11-26 ENCOUNTER — Encounter: Payer: Self-pay | Admitting: Internal Medicine

## 2012-12-10 ENCOUNTER — Encounter: Payer: Self-pay | Admitting: Internal Medicine

## 2013-02-23 ENCOUNTER — Encounter: Payer: Self-pay | Admitting: Internal Medicine

## 2013-03-03 ENCOUNTER — Other Ambulatory Visit: Payer: Self-pay

## 2013-05-30 ENCOUNTER — Other Ambulatory Visit: Payer: Self-pay | Admitting: *Deleted

## 2013-05-30 NOTE — Telephone Encounter (Signed)
Opened in error. Robin [name], PBT Performing research review

## 2013-07-29 IMAGING — CR DG ABDOMEN 1V
1 series · 1 of 1 positions shown · non-contrast
Comparison: 04/03/2011 abdominal CT.

CLINICAL DATA: Nausea and vomiting.  Abdominal distention.

ABDOMEN - 1 VIEW

[t abdomen supine]
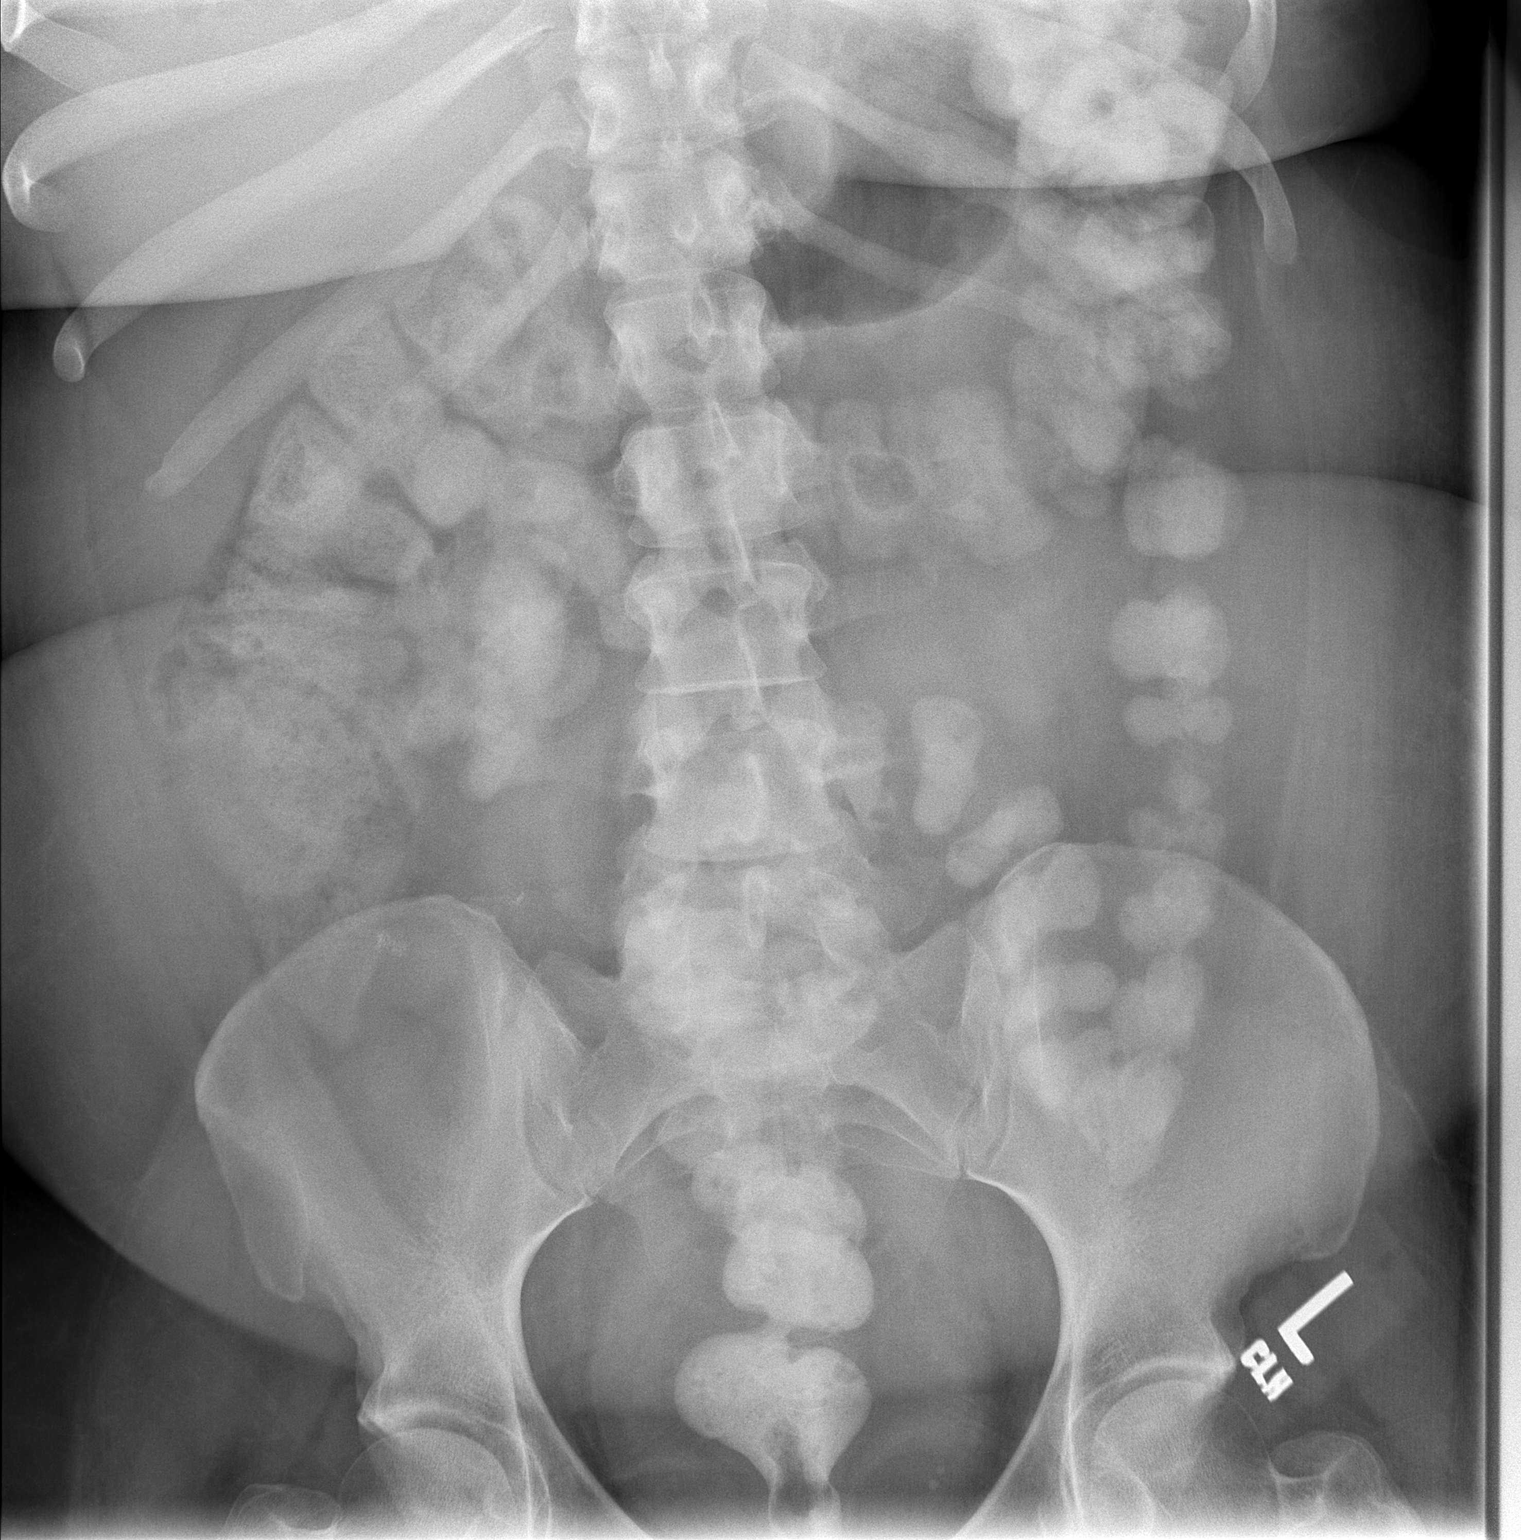

[1 of 1 positions shown; findings below may reference images not displayed]

FINDINGS: Distal progression of contrast.  Surgical staples are
present in the right lower quadrant compatible with appendectomy.
Nonobstructive bowel gas pattern.  No plain film evidence of free
air on this supine view.
IMPRESSION: Normal bowel gas pattern status post appendectomy.  No evidence of
ileus or obstruction.

## 2013-07-29 IMAGING — CR DG ABD PORTABLE 1V
2 series · 2 of 2 positions shown · non-contrast
Comparison: Prior today

CLINICAL DATA: Abdominal pain.  Nausea and vomiting.  Postop from
appendectomy.

ABDOMEN - 1 VIEW

[AP (1 of 2)]
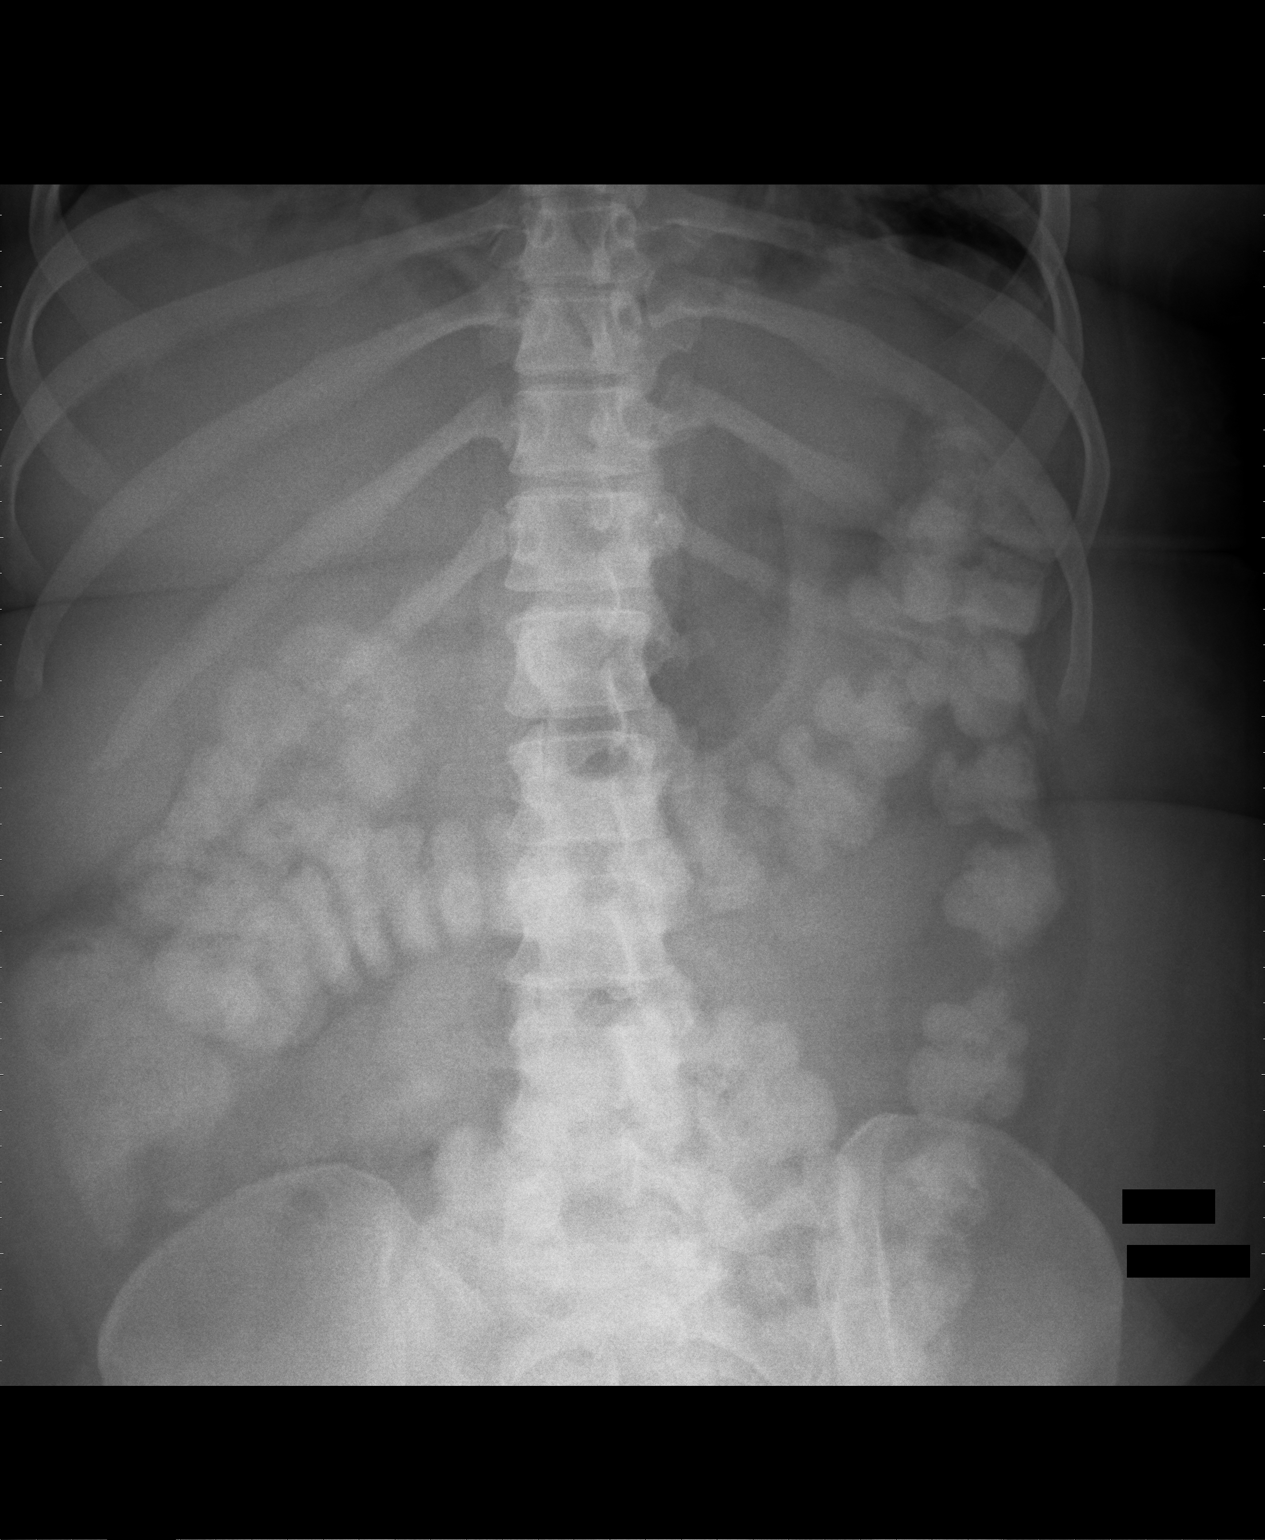

[AP (2 of 2)]
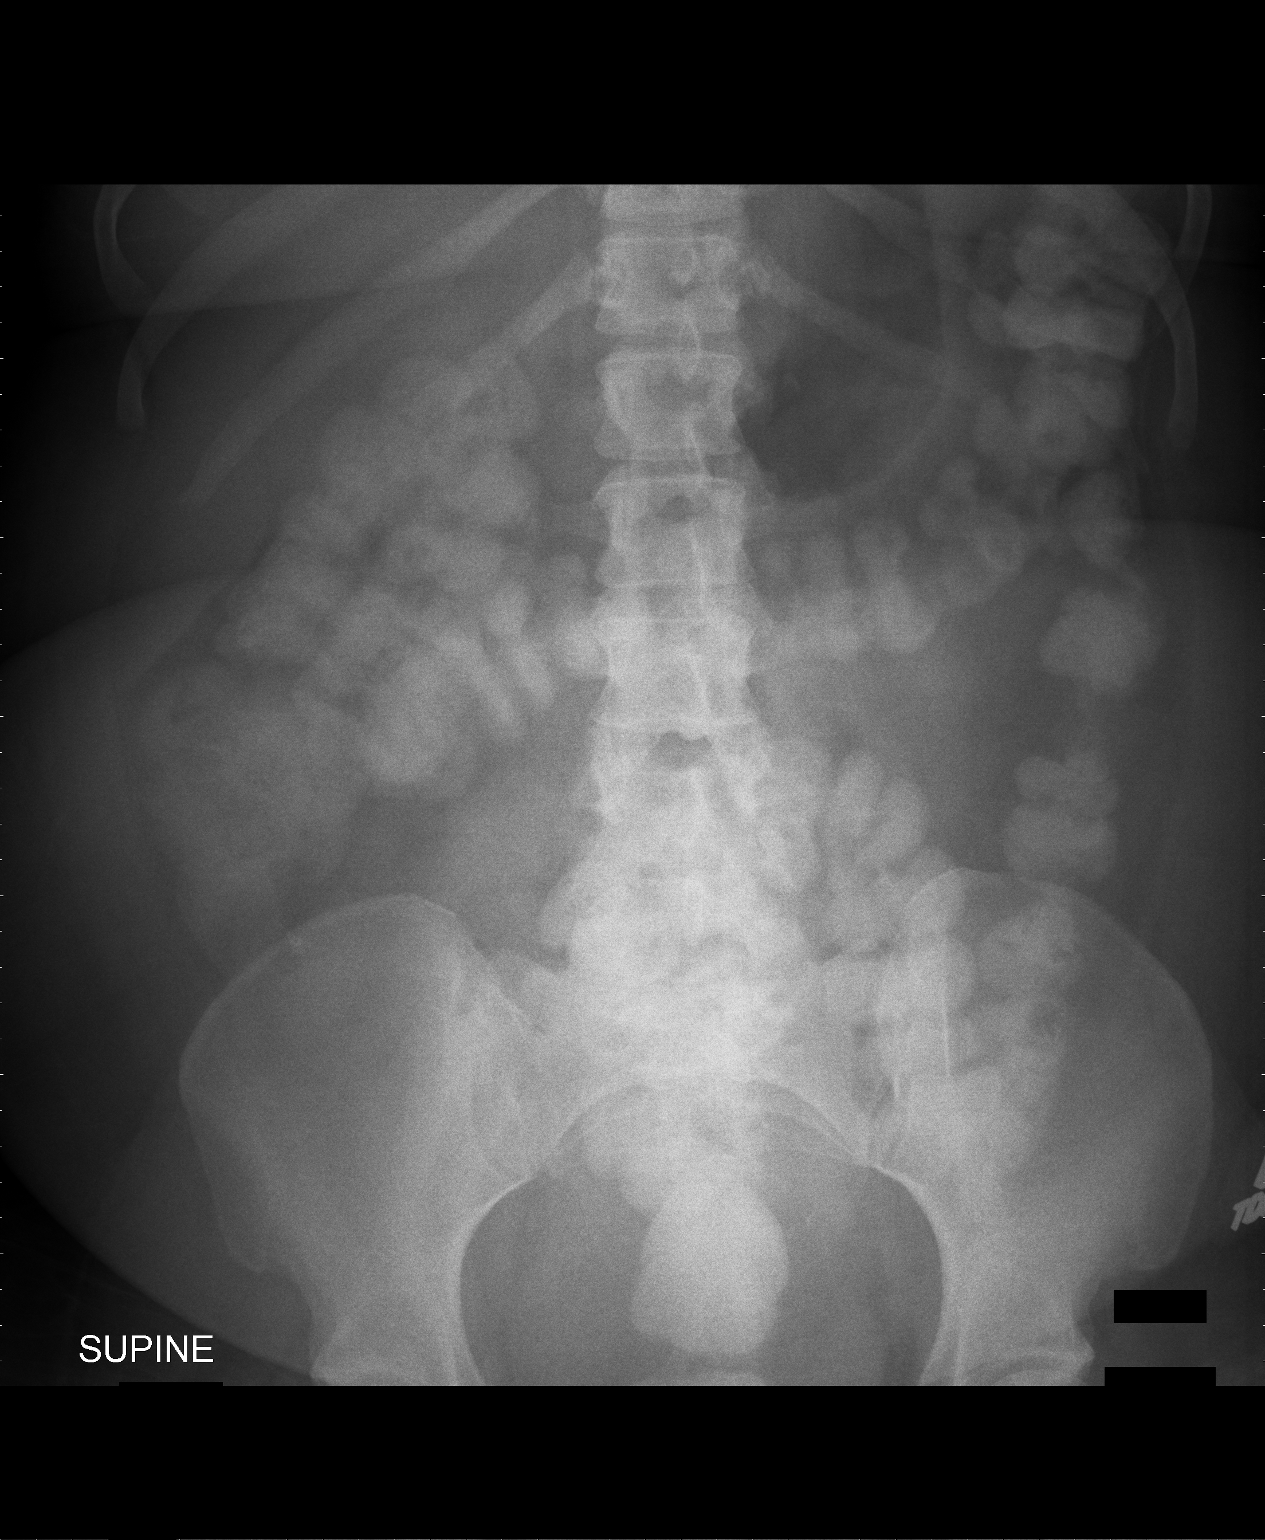

[2 of 2 positions shown; findings below may reference images not displayed]

FINDINGS: Residual contrast is again seen throughout the colon.  No
evidence of dilated bowel loops or other radiographic abnormality.
IMPRESSION: Normal bowel gas pattern.  Residual contrast again seen throughout
colon.

## 2013-08-04 ENCOUNTER — Other Ambulatory Visit: Payer: Self-pay

## 2014-02-03 ENCOUNTER — Encounter: Payer: Self-pay | Admitting: *Deleted
